# Patient Record
Sex: Male | Born: 1940 | Race: White | Hispanic: No | Marital: Married | Smoking: Never smoker
Health system: Southern US, Community
[De-identification: ages and names within clinical notes are randomized; demographics above are authoritative.]

## PROBLEM LIST (undated history)

## (undated) DIAGNOSIS — H9319 Tinnitus, unspecified ear: Secondary | ICD-10-CM

## (undated) DIAGNOSIS — M1990 Unspecified osteoarthritis, unspecified site: Secondary | ICD-10-CM

## (undated) DIAGNOSIS — I2510 Atherosclerotic heart disease of native coronary artery without angina pectoris: Secondary | ICD-10-CM

## (undated) DIAGNOSIS — B009 Herpesviral infection, unspecified: Secondary | ICD-10-CM

## (undated) DIAGNOSIS — R079 Chest pain, unspecified: Secondary | ICD-10-CM

## (undated) DIAGNOSIS — H9190 Unspecified hearing loss, unspecified ear: Secondary | ICD-10-CM

## (undated) DIAGNOSIS — I499 Cardiac arrhythmia, unspecified: Secondary | ICD-10-CM

## (undated) DIAGNOSIS — M722 Plantar fascial fibromatosis: Secondary | ICD-10-CM

## (undated) DIAGNOSIS — I4891 Unspecified atrial fibrillation: Secondary | ICD-10-CM

## (undated) DIAGNOSIS — N400 Enlarged prostate without lower urinary tract symptoms: Secondary | ICD-10-CM

## (undated) DIAGNOSIS — I00 Rheumatic fever without heart involvement: Secondary | ICD-10-CM

## (undated) HISTORY — PX: LAMINECTOMY: SHX219

## (undated) HISTORY — PX: VASECTOMY: SHX75

## (undated) HISTORY — PX: CATARACT EXTRACTION, BILATERAL: SHX1313

## (undated) HISTORY — DX: Rheumatic fever without heart involvement: I00

## (undated) HISTORY — DX: Herpesviral infection, unspecified: B00.9

## (undated) HISTORY — DX: Chest pain, unspecified: R07.9

## (undated) HISTORY — PX: COLONOSCOPY: SHX174

## (undated) HISTORY — DX: Tinnitus, unspecified ear: H93.19

## (undated) HISTORY — DX: Unspecified atrial fibrillation: I48.91

## (undated) HISTORY — PX: HERNIA REPAIR: SHX51

## (undated) HISTORY — DX: Plantar fascial fibromatosis: M72.2

## (undated) HISTORY — DX: Benign prostatic hyperplasia without lower urinary tract symptoms: N40.0

## (undated) HISTORY — PX: OTHER SURGICAL HISTORY: SHX169

## (undated) HISTORY — DX: Unspecified osteoarthritis, unspecified site: M19.90

## (undated) HISTORY — PX: TONSILLECTOMY: SUR1361

## (undated) HISTORY — DX: Unspecified hearing loss, unspecified ear: H91.90

---

## 1999-03-28 ENCOUNTER — Encounter: Payer: Self-pay | Admitting: *Deleted

## 1999-03-28 ENCOUNTER — Encounter: Admission: RE | Admit: 1999-03-28 | Discharge: 1999-03-28 | Payer: Self-pay | Admitting: *Deleted

## 1999-11-07 ENCOUNTER — Encounter: Admission: RE | Admit: 1999-11-07 | Discharge: 1999-11-07 | Payer: Self-pay | Admitting: Surgery

## 1999-11-07 ENCOUNTER — Encounter: Payer: Self-pay | Admitting: Surgery

## 1999-11-08 ENCOUNTER — Ambulatory Visit (HOSPITAL_BASED_OUTPATIENT_CLINIC_OR_DEPARTMENT_OTHER): Admission: RE | Admit: 1999-11-08 | Discharge: 1999-11-08 | Payer: Self-pay | Admitting: Surgery

## 2004-12-07 ENCOUNTER — Encounter: Payer: Self-pay | Admitting: Internal Medicine

## 2004-12-23 ENCOUNTER — Encounter: Payer: Self-pay | Admitting: Internal Medicine

## 2005-01-23 ENCOUNTER — Ambulatory Visit: Payer: Self-pay | Admitting: Cardiology

## 2005-01-26 ENCOUNTER — Encounter (INDEPENDENT_AMBULATORY_CARE_PROVIDER_SITE_OTHER): Payer: Self-pay | Admitting: Interventional Cardiology

## 2005-01-26 ENCOUNTER — Ambulatory Visit (HOSPITAL_COMMUNITY): Admission: RE | Admit: 2005-01-26 | Discharge: 2005-01-26 | Payer: Self-pay | Admitting: Interventional Cardiology

## 2005-01-26 ENCOUNTER — Encounter: Payer: Self-pay | Admitting: Internal Medicine

## 2005-03-08 ENCOUNTER — Encounter: Payer: Self-pay | Admitting: Internal Medicine

## 2005-03-08 ENCOUNTER — Ambulatory Visit (HOSPITAL_COMMUNITY): Admission: RE | Admit: 2005-03-08 | Discharge: 2005-03-08 | Payer: Self-pay | Admitting: Interventional Cardiology

## 2005-07-27 ENCOUNTER — Encounter: Payer: Self-pay | Admitting: Internal Medicine

## 2009-07-21 ENCOUNTER — Encounter: Payer: Self-pay | Admitting: Internal Medicine

## 2009-08-18 DIAGNOSIS — I4892 Unspecified atrial flutter: Secondary | ICD-10-CM | POA: Insufficient documentation

## 2009-08-18 DIAGNOSIS — I4460 Unspecified fascicular block: Secondary | ICD-10-CM

## 2009-08-19 ENCOUNTER — Telehealth: Payer: Self-pay | Admitting: Internal Medicine

## 2009-08-19 ENCOUNTER — Ambulatory Visit: Payer: Self-pay | Admitting: Internal Medicine

## 2009-08-19 DIAGNOSIS — I4891 Unspecified atrial fibrillation: Secondary | ICD-10-CM | POA: Insufficient documentation

## 2009-08-31 ENCOUNTER — Telehealth (INDEPENDENT_AMBULATORY_CARE_PROVIDER_SITE_OTHER): Payer: Self-pay | Admitting: *Deleted

## 2009-09-10 HISTORY — PX: OTHER SURGICAL HISTORY: SHX169

## 2009-09-14 ENCOUNTER — Encounter: Payer: Self-pay | Admitting: Internal Medicine

## 2009-09-21 ENCOUNTER — Ambulatory Visit: Payer: Self-pay | Admitting: Internal Medicine

## 2009-09-21 DIAGNOSIS — D66 Hereditary factor VIII deficiency: Secondary | ICD-10-CM

## 2009-09-22 LAB — CONVERTED CEMR LAB
BUN: 24 mg/dL — ABNORMAL HIGH (ref 6–23)
Basophils Absolute: 0 10*3/uL (ref 0.0–0.1)
Basophils Relative: 0.9 % (ref 0.0–3.0)
CO2: 27 meq/L (ref 19–32)
Calcium: 9.1 mg/dL (ref 8.4–10.5)
Chloride: 112 meq/L (ref 96–112)
Creatinine, Ser: 0.7 mg/dL (ref 0.4–1.5)
Eosinophils Absolute: 0.2 10*3/uL (ref 0.0–0.7)
Eosinophils Relative: 3.5 % (ref 0.0–5.0)
GFR calc non Af Amer: 128.73 mL/min (ref >60)
Glucose, Bld: 68 mg/dL — ABNORMAL LOW (ref 70–99)
HCT: 44 % (ref 39.0–52.0)
Hemoglobin: 15.2 g/dL (ref 13.0–17.0)
INR: 2.1 — ABNORMAL HIGH (ref 0.8–1.0)
Lymphocytes Relative: 23 % (ref 12.0–46.0)
Lymphs Abs: 1.1 10*3/uL (ref 0.7–4.0)
MCHC: 34.4 g/dL (ref 30.0–36.0)
MCV: 91.9 fL (ref 78.0–100.0)
Monocytes Absolute: 0.6 10*3/uL (ref 0.1–1.0)
Monocytes Relative: 12.5 % — ABNORMAL HIGH (ref 3.0–12.0)
Neutro Abs: 3 10*3/uL (ref 1.4–7.7)
Neutrophils Relative %: 60.1 % (ref 43.0–77.0)
Platelets: 150 10*3/uL (ref 150.0–400.0)
Potassium: 4.2 meq/L (ref 3.5–5.1)
Prothrombin Time: 22.7 s — ABNORMAL HIGH (ref 9.7–11.8)
RBC: 4.79 M/uL (ref 4.22–5.81)
RDW: 14.4 % (ref 11.5–14.6)
Sodium: 142 meq/L (ref 135–145)
WBC: 5 10*3/uL (ref 4.5–10.5)
aPTT: 42.6 s — ABNORMAL HIGH (ref 21.7–28.8)

## 2009-09-28 ENCOUNTER — Other Ambulatory Visit: Payer: Self-pay | Admitting: Interventional Cardiology

## 2009-09-28 ENCOUNTER — Ambulatory Visit: Payer: Self-pay | Admitting: Internal Medicine

## 2009-09-28 ENCOUNTER — Observation Stay (HOSPITAL_COMMUNITY): Admission: RE | Admit: 2009-09-28 | Discharge: 2009-09-29 | Payer: Self-pay | Admitting: Internal Medicine

## 2009-09-28 ENCOUNTER — Encounter (INDEPENDENT_AMBULATORY_CARE_PROVIDER_SITE_OTHER): Payer: Self-pay | Admitting: Interventional Cardiology

## 2009-11-29 ENCOUNTER — Ambulatory Visit: Payer: Self-pay | Admitting: Internal Medicine

## 2010-03-02 ENCOUNTER — Encounter: Payer: Self-pay | Admitting: Internal Medicine

## 2010-03-02 ENCOUNTER — Ambulatory Visit: Payer: Self-pay | Admitting: Internal Medicine

## 2010-04-12 NOTE — Letter (Signed)
Summary: Spirometry  Spirometry   Imported By: Marylou Mccoy 09/08/2009 14:03:58  _____________________________________________________________________  External Attachment:    Type:   Image     Comment:   External Document

## 2010-04-12 NOTE — Progress Notes (Signed)
  Walk in Patient Form Recieved " Pt having procedure on 7/19has questions" sent to [facility]  August 31, 2009 2:09 PM

## 2010-04-12 NOTE — Assessment & Plan Note (Signed)
Summary: eph/jml   Visit Type:  Follow-up Referring Provider:  Dr Isabel Caprice Primary Provider:  Kirby Funk, MD   History of Present Illness: The patient presents today for routine electrophysiology followup. He reports doing very well since his afib ablation.  He denies post procedural complications and has had no further sypmtoms of arrhythmia.  He reports mild SSCP several days ago which has resolved.  His energy has improved and he reports doing very well s/p ablation.  The patient denies symptoms of palpitations, shortness of breath, orthopnea, PND, lower extremity edema, dizziness, presyncope, syncope, or neurologic sequela. The patient is tolerating medications without difficulties and is otherwise without complaint today.   Current Medications (verified): 1)  Sotalol Hcl 120 Mg Tabs (Sotalol Hcl) .Marland Kitchen.. 1 in Am 1 in Pm 2)  Metoprolol Succinate 100 Mg Xr24h-Tab (Metoprolol Succinate) .... As Needed 3)  Warfarin Sodium 5 Mg Tabs (Warfarin Sodium) .... Use As Directed By Anticoagulation Clinic 4)  Acyclovir 400 Mg Tabs (Acyclovir) .... Every Other Day 5)  Glucosamine-Chondroitin   Caps (Glucosamine-Chondroit-Vit C-Mn) .... 2 in Am 1 in Pm 6)  Multivitamins   Tabs (Multiple Vitamin) .... Once Daily 7)  Calcium Carbonate   Powd (Calcium Carbonate) .... Once Daily 8)  Fexofenadine Hcl 180 Mg Tabs (Fexofenadine Hcl) .... As Needed 9)  Nasonex 50 Mcg/act Susp (Mometasone Furoate) .... As Needed  Allergies (verified): No Known Drug Allergies  Past History:  Past Medical History: Atrial fibrillation, atrial flutter s/p PVI/CTI ablation 7/11 BPH allergic rhinitis DJD  Denies h/o rheumatic fever  Past Surgical History: s/p appy Laminectomy hernia repair arthroscopic knee surgery afib/ atrial flutter ablation 7/11  Social History: Reviewed history from 08/19/2009 and no changes required. Pt lives in [place] with spouse.  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   Denies Tob/drugs  Vital Signs:  Patient profile:   70 year old male Height:      70 inches Weight:      217 pounds BMI:     31.25 Pulse rate:   52 / minute BP sitting:   120 / 80  (left arm)  Vitals Entered By: Laurance Flatten CMA (November 29, 2009 12:05 PM)  Physical Exam  General:  Well developed, well nourished, in no acute distress. Head:  normocephalic and atraumatic Eyes:  PERRLA/EOM intact; conjunctiva and lids normal. Mouth:  Teeth, gums and palate normal. Oral mucosa normal. Neck:  Neck supple, no JVD. No masses, thyromegaly or abnormal cervical nodes. Lungs:  Clear bilaterally to auscultation and percussion. Heart:  Non-displaced PMI, chest non-tender; regular rate and rhythm, S1, S2 without murmurs, rubs or gallops. Carotid upstroke normal, no bruit. Normal abdominal aortic size, no bruits. Femorals normal pulses, no bruits. Pedals normal pulses. No edema, no varicosities. Abdomen:  Bowel sounds positive; abdomen soft and non-tender without masses, organomegaly, or hernias noted. No hepatosplenomegaly. Msk:  Back normal, normal gait. Muscle strength and tone normal. Pulses:  pulses normal in all 4 extremities Extremities:  No clubbing or cyanosis. Neurologic:  Alert and oriented x 3.   EKG  Procedure date:  11/29/2009  Findings:      sinus bradycardia  Impression & Recommendations:  Problem # 1:  ATRIAL FIBRILLATION (ICD-427.31) doing very well without afib or atrial flutter symptoms s/p ablation. I have recommended that he stop sotalol, however he wisehes to discuss this with Dr Eldridge Dace first. His CHADS2 score is 0, so we will consider switching from coumadin to asprin if he remains in  sinus upon return  Other Orders: EKG w/ Interpretation (93000)  Patient Instructions: 1)  Your physician recommends that you schedule a follow-up appointment in: 3 months with Dr Johney Frame  2)  Your physician recommends that you continue on your current medications as directed.  Please refer to the Current Medication list given to you today.

## 2010-04-12 NOTE — Assessment & Plan Note (Signed)
Summary: nep/eval for ablation   Visit Type:  Initial Consult Referring Provider:  Dr Isabel Caprice Primary Provider:  Kirby Funk, MD   History of Present Illness: [name] is a pleasant 70 yo WM who presents today for EP consultation regarding atrial fibrillation.  He reports initially developing atrial fibrillation in 2006.  He was treated initially with amiodarone.  He reports doing well initially but subsequently had increased afib.  He was therefore placed on Sotalol, which then controlled his afib for several years.  Recently, he reports increasing frequency and duration of atrial fibrillation.  Presently, he reports episodes of afib 2-4 times per week, lasting up to all day.  He is chronically anticoagulated with coumadin.  He reports afib is worsened with stress.  He frequency wakes from sleep with afib.    He is now wearing an event monitor which has documented both afib and atrial flutter.  Current Medications (verified): 1)  Sotalol Hcl 120 Mg Tabs (Sotalol Hcl) .Marland Kitchen.. 1 in Am 1/2 in Pm 2)  Metoprolol Succinate 100 Mg Xr24h-Tab (Metoprolol Succinate) .... As Needed 3)  Warfarin Sodium 5 Mg Tabs (Warfarin Sodium) .... Use As Directed By Anticoagulation Clinic 4)  Acyclovir 400 Mg Tabs (Acyclovir) .... Every Other Day 5)  Glucosamine-Chondroitin   Caps (Glucosamine-Chondroit-Vit C-Mn) .... 2 in Am 1 in Pm 6)  Multivitamins   Tabs (Multiple Vitamin) 7)  Calcium Carbonate   Powd (Calcium Carbonate) .... Once Daily 8)  Fexofenadine Hcl 180 Mg Tabs (Fexofenadine Hcl) .... As Needed 9)  Nasonex 50 Mcg/act Susp (Mometasone Furoate) .... As Needed  Allergies (verified): No Known Drug Allergies  Past History:  Past Medical History: Atrial fibrillation, atrial flutter BPH allergic rhinitis DJD  Denies h/o rheumatic fever  Past Surgical History: s/p appy Laminectomy hernia repair arthroscopic knee surgery  Family History: father had afib and parkinsons brother died of heart  attack  Social History: Pt lives in [place] with spouse.  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  Denies Tob/drugs  Review of Systems       All systems are reviewed and negative except as listed in the HPI.   Vital Signs:  Patient profile:   70 year old male Height:      70 inches Weight:      215 pounds BMI:     30.96 Pulse rate:   49 / minute BP sitting:   132 / 88  (left arm)  Vitals Entered By: Laurance Flatten CMA (August 19, 2009 12:37 PM)  Physical Exam  General:  Well developed, well nourished, in no acute distress. Head:  normocephalic and atraumatic Eyes:  PERRLA/EOM intact; conjunctiva and lids normal. Nose:  no deformity, discharge, inflammation, or lesions Mouth:  Teeth, gums and palate normal. Oral mucosa normal. Neck:  Neck supple, no JVD. No masses, thyromegaly or abnormal cervical nodes. Lungs:  Clear bilaterally to auscultation and percussion. Heart:  Non-displaced PMI, chest non-tender; regular rate and rhythm, S1, S2 without murmurs, rubs or gallops. Carotid upstroke normal, no bruit. Normal abdominal aortic size, no bruits. Femorals normal pulses, no bruits. Pedals normal pulses. No edema, no varicosities. Abdomen:  Bowel sounds positive; abdomen soft and non-tender without masses, organomegaly, or hernias noted. No hepatosplenomegaly. Msk:  Back normal, normal gait. Muscle strength and tone normal. Pulses:  pulses normal in all 4 extremities Extremities:  No clubbing or cyanosis. Neurologic:  Alert and oriented x 3. Skin:  Intact without lesions or rashes. Cervical Nodes:  no significant adenopathy Psych:  Normal affect.   Echocardiogram  Procedure date:  01/26/2005  Findings:        SUMMARY   -  Overall left ventricular systolic function appeared normal. Left         ventricular ejection fraction was estimated , range being 55         % to 60 %..   -  There was mild to moderate mitral valvular regurgitation.   -  The left atrium was  dilated. There was no left atrial appendage         thrombus identified.   -  No interatrial shunt by color doppler.    ---------------------------------------------------------------   Prepared and Electronically Authenticated by   Everette Rank MD   Amended 08-Feb-2005 09:14:03      EKG  Procedure date:  08/19/2009  Findings:      sinus bradycardia 50 bpm, otherwise normal ekg  Impression & Recommendations:  Problem # 1:  ATRIAL FIBRILLATION (ICD-427.31) The patient presents today for EP consultation regarding his atrial fibrillation.  He has symptomatic paroxysmal atrial fibrillation as well as documented atrial flutter.  He has failed medical therapy with sotalol and amiodarone.  Therapeutic strategies for afib and atrial flutter including medicine and ablation were discussed in detail with the patient today. Risk, benefits, and alternatives to EP study and radiofrequency ablation were also discussed in detail today. These risks include but are not limited to stroke, bleeding, vascular damage, tamponade, perforation, damage to the esophagus, lungs, and other structures, pulmonary vein stenosis, worsening renal function, and death. The patient understands these risk and wishes to proceed.  We will therefore proceed with catheter ablation for atrial fibrillation and atrial flutter at the next available time.  I will have a TTE obtained prior to the procedure to evaluate his LA size.  I have reviewed an echo from 2006 which revealed LA size of 48mm.   His updated medication list for this problem includes:    Sotalol Hcl 120 Mg Tabs (Sotalol hcl) .Marland Kitchen... 1 in am 1/2 in pm    Metoprolol Succinate 100 Mg Xr24h-tab (Metoprolol succinate) .Marland Kitchen... As needed    Warfarin Sodium 5 Mg Tabs (Warfarin sodium) ..... Use as directed by anticoagulation clinic  Other Orders: Ablation (Ablation)  Patient Instructions: 1)  Your physician has requested that you have a TEE.  During a TEE, sound waves  are used to create images of your heart. It provides your doctor with information about the size and shape of your heart and how well your heart's chambers and valves are working. In this test, a transducer is attached to the end of a flexible tube that's guided down your throat and into your esophagus (the tube leading from your mouth to your stomach) to get a more detailed image of your heart. You are not awake for the procedure. Please see the instruction sheet given to you today.  For further information please visit [email or web address] 2)  Your physician has recommended that you have an ablation.  Catheter ablation is a medical procedure used to treat some cardiac arrhythmias (irregular heartbeats). During catheter ablation, a long, thin, flexible tube is put into a blood vessel in your groin (upper thigh), or neck. This tube is called an ablation catheter. It is then guided to your heart through the blood vessel. Radiofrequency waves destroy small areas of heart tissue where abnormal heartbeats may cause an arrhythmia to start.  Please see the instruction sheet given to you  today.

## 2010-04-12 NOTE — Progress Notes (Signed)
Summary: TEE for afib ablation  Phone Note Outgoing Call Call back at [facility] Phone [phone]   Call placed by: Gypsy Balsam RN BSN,  August 19, 2009 3:30 PM Summary of Call: Spoke with Bonita Quin at Dr Hoyle Barr office.  TEE scheduled for Monday, September 27, 2009 at 1PM.  Pt to arrive at 12Noon.  She stated she will take care of orders and scheduling at hospital.  Dr Johney Frame requested transthoracic echo be done next week to evaluate LA size.  Last evaluation was in 2006.  Scheduled for 08-26-09 at 11:15AM at [facility].  Riki Rusk with [facility] aware of need for weekly INR's.  Wife aware and agrees with above. Gypsy Balsam RN BSN  August 19, 2009 3:37 PM

## 2010-04-12 NOTE — Letter (Signed)
Summary: ELectrophysiology/Ablation Procedure Instructions  [facility], Main Office  [address]   [place], [place] [address]   Phone: [phone]  Fax: [phone]     Electrophysiology/Ablation Procedure Instructions    You are scheduled for a(n) AFIB ABLATION on TUESDAY, September 28, 2009 at 7:30 AM with Dr. Johney Frame.  1.  Please come to the Short Stay Center at [facility] at 5:30 AM on the day of your procedure.  2.  Come prepared to stay overnight.   Please bring your insurance cards and a list of your medications.  3.  Come to the [place] office on TUESDAY, September 21, 2009 for lab work.  The lab at [facility] is open from 8:30 AM to 1:30 PM and 2:30 PM to 5:00 PM.  The lab at [facility] is open from 7:30 AM to 5:30 PM.  You do not have to be fasting.  4.  Do not have anything to eat or drink after midnight the night before your procedure.  5.  All of your medications may be taken with a small amount of water.  6.  Educational material received:  _____ EP   __X___ Ablation  7.  You need to have weekly Coumadin (INR) checks between now and the procedure.  Please let Riki Rusk at [facility] know.  8.  You will need to have a TEE on Monday, September 27, 2009.  I will call you with details after we hear from Dr Hoyle Barr office about his availability.   * Occasionally, EP studies and ablations can become lengthy.  Please make your family aware of this before your procedure starts.  Average time ranges from 2-8 hours for EP studies/ablations.  Your physician will locate your family after the procedure with the results.  * If you have any questions after you get home, please call the office at [phone].

## 2010-04-12 NOTE — Progress Notes (Signed)
Summary: Letter with List of Patient Concerns   Letter with List of Patient Concerns   Imported By: Roderic Ovens 12/15/2009 12:03:08  _____________________________________________________________________  External Attachment:    Type:   Image     Comment:   External Document

## 2010-04-14 NOTE — Assessment & Plan Note (Signed)
Summary: Frederick Glover   Visit Type:  Follow-up Referring Provider:  Dr Isabel Caprice Primary Provider:  Kirby Funk, MD   History of Present Illness: The patient presents today for routine electrophysiology followup. He reports doing very well since his afib ablation.  He denies post procedural complications and has had no further sypmtoms of arrhythmia.  He is pleased with his results. The patient denies symptoms of palpitations, shortness of breath, orthopnea, PND, lower extremity edema, dizziness, presyncope, syncope, or neurologic sequela. The patient is tolerating medications without difficulties and is otherwise without complaint today.   Current Medications (verified): 1)  Sotalol Hcl 80 Mg Tabs (Sotalol Hcl) .... Take One Tablet By Mouth Twice A Day 2)  Metoprolol Succinate 100 Mg Xr24h-Tab (Metoprolol Succinate) .... As Needed Frederick)  Warfarin Sodium 5 Mg Tabs (Warfarin Sodium) .... Use As Directed By Anticoagulation Clinic 4)  Acyclovir 400 Mg Tabs (Acyclovir) .... Every Other Day 5)  Glucosamine-Chondroitin   Caps (Glucosamine-Chondroit-Vit C-Mn) .... 2 in Am 1 in Pm 6)  Multivitamins   Tabs (Multiple Vitamin) .... Once Daily 7)  Calcium Carbonate   Powd (Calcium Carbonate) .... Once Daily 8)  Fexofenadine Hcl 180 Mg Tabs (Fexofenadine Hcl) .... As Needed 9)  Nasonex 50 Mcg/act Susp (Mometasone Furoate) .... As Needed  Allergies (verified): No Known Drug Allergies  Past History:  Past Medical History: Reviewed history from 11/29/2009 and no changes required. Atrial fibrillation, atrial flutter s/p PVI/CTI ablation 7/11 BPH allergic rhinitis DJD  Denies h/o rheumatic fever  Past Surgical History: Reviewed history from 11/29/2009 and no changes required. s/p appy Laminectomy hernia repair arthroscopic knee surgery afib/ atrial flutter ablation 7/11  Vital Signs:  Patient profile:   70 year old male Height:      70 inches Weight:      217 pounds BMI:      31.25 Pulse rate:   59 / minute BP sitting:   120 / 80  (left arm)  Vitals Entered By: Laurance Flatten CMA (March 02, 2010 11:06 AM)  Physical Exam  General:  Well developed, well nourished, in no acute distress. Head:  normocephalic and atraumatic Eyes:  PERRLA/EOM intact; conjunctiva and lids normal. Mouth:  Teeth, gums and palate normal. Oral mucosa normal. Neck:  Neck supple, no JVD. No masses, thyromegaly or abnormal cervical nodes. Lungs:  Clear bilaterally to auscultation and percussion. Heart:  Non-displaced PMI, chest non-tender; regular rate and rhythm, S1, S2 without murmurs, rubs or gallops. Carotid upstroke normal, no bruit. Normal abdominal aortic size, no bruits. Femorals normal pulses, no bruits. Pedals normal pulses. No edema, no varicosities. Abdomen:  Bowel sounds positive; abdomen soft and non-tender without masses, organomegaly, or hernias noted. No hepatosplenomegaly. Msk:  Back normal, normal gait. Muscle strength and tone normal. Extremities:  No clubbing or cyanosis. Neurologic:  Alert and oriented x Frederick.   EKG  Procedure date:  03/02/2010  Findings:      sinus bradycardia 59 bpm, otherwise normal ekg  Impression & Recommendations:  Problem # 1:  ATRIAL FIBRILLATION (ICD-427.31)  doing very well without afib or atrial flutter symptoms s/p ablation. I have recommended that he stop sotalol, however he wisehes to discuss this with Dr Eldridge Dace first. His CHADS2 score is 0, so we will consider switching from coumadin to asprin if he remains in sinus  His updated medication list for this problem includes:    Sotalol Hcl 80 Mg Tabs (Sotalol hcl) .Marland Kitchen... Take one tablet by mouth twice a day  Metoprolol Succinate 100 Mg Xr24h-tab (Metoprolol succinate) .Marland Kitchen... As needed    Warfarin Sodium 5 Mg Tabs (Warfarin sodium) ..... Use as directed by anticoagulation clinic  Other Orders: EKG w/ Interpretation (93000)  Patient Instructions: 1)  Your physician recommends  that you schedule a follow-up appointment in: 6 months. 2)  Your physician recommends that you continue on your current medications as directed. Please refer to the Current Medication list given to you today.

## 2010-05-04 NOTE — Letter (Signed)
Summary: Self-Recorded Notes for Dr. Johney Frame  Self-Recorded Notes for Dr. Johney Frame   Imported By: Marylou Mccoy 04/25/2010 14:56:32  _____________________________________________________________________  External Attachment:    Type:   Image     Comment:   External Document

## 2010-05-28 LAB — PROTIME-INR
INR: 2.26 — ABNORMAL HIGH (ref 0.00–1.49)
Prothrombin Time: 24.8 seconds — ABNORMAL HIGH (ref 11.6–15.2)

## 2010-07-29 NOTE — Op Note (Signed)
NAMERHET, RORKE               ACCOUNT NO.:  [id number]   MEDICAL RECORD NO.:  [id number]          PATIENT TYPE:  AMB   LOCATION:  ENDO                         FACILITY:  [facility]   PHYSICIAN:  Corky Crafts, MDDATE OF BIRTH:  04-Jun-1940   DATE OF PROCEDURE:  01/26/2005  DATE OF DISCHARGE:                                 OPERATIVE REPORT   PROCEDURE PERFORMED:  DC cardioversion.   INDICATIONS FOR PROCEDURE:  Atrial fibrillation.   OPERATOR:  Corky Crafts, MD   ANESTHESIOLOGIST:  Bedelia Person, M.D.   ANESTHESIA:  175 mg of sodium pentothal IV.   DESCRIPTION OF PROCEDURE:  Informed consent was obtained from the patient  for transesophageal echocardiogram and DC cardioversion.  The  transesophageal echocardiogram showed no intracardiac thrombus.  Cardioversion pads were placed on the chest, back.  Sodium pentothal 175 mg  IV was given x1.  After adequate anesthesia was achieved, a 100 joule  biphasic shock was administered without restoration of normal sinus rhythm.  A 120 joule biphasic shock was subsequently administered and normal sinus  rhythm was restored.  The patient varied between normal sinus rhythm and  sinus bradycardia post cardioversion. There were no apparent complications.   IMPRESSION:  1.  No intracardiac thrombus.  2.  Successful restoration of normal sinus rhythm with DC cardioversion.   RECOMMENDATIONS:  Will decrease the patient's metoprolol to 50 mg daily.  Patient will follow up with Corky Crafts, MD           ______________________________  Corky Crafts, MD     JSV/MEDQ  D:  01/26/2005  T:  01/27/2005  Job:  [id number]

## 2010-07-29 NOTE — Op Note (Signed)
[place]. [facility]  Patient:    CHEROKEE, CLOWERS                      MRN: [id number] Proc. Date: 11/08/99 Adm. Date:  [id number] Attending:  Katha Cabal CC:         Lum Babe, M.D.   Operative Report  PREOPERATIVE DIAGNOSIS:  Left inguinal hernia.  POSTOPERATIVE DIAGNOSIS:  Left indirect inguinal hernia.  OPERATION:  Left inguinal herniorrhaphy with mesh.  SURGEON:  Thornton Park. Daphine Deutscher, M.D.  ANESTHESIA:  MAC.  DESCRIPTION OF PROCEDURE:  Eual Lindstrom is a 70 year old gentleman who was taken to [facility], OR #4 and given intravenous sedation.  The left inguinal region was prep widely with Betadine and draped sterilely.  I did a regional block with a mixture of Marcaine and lidocaine over the anterior superior iliac spine and then injected an oblique incision which I had marked along with a cross hatch mark, which I further marked with a stitch.  An oblique incision was then made, carried down to the fat of the external oblique.  This was incised along the fibers down to the external ring.  An ilioinguinal nerve branch was identified and retracted with the superior flap of the external oblique.  The cord structures were mobilized and Penrose drain placed about it.  I incised the cremasteric fibers proximally and found a very large sac which was probably about 4 inches in length and I separated that from the cord structures.  I then opened it and found that it contained an omentum which I reduced into the abdominal cavity.  There was a little free intraperitoneal abdominal body which I put out subsequently to go with the hernia sac.  After freeing the sac and identifying the ring, I oversewed that with 2-0 silk proximally, twisted it off and ligated it tying it around the hernia sac.  The hernia sac was then cut and it was examined and no bleeding was noted and the suture was cut so that the remnant of sac retracted up into the  abdomen.  Next, I cut a piece of mesh to fix.  I had good visualization, sutured long the inguinal ligament with a 3-0 Prolene carrying it above the ring.  Medially I used a running 2-0 Prolene medially carrying it up superiorly.  I cut the mesh to fit, placed it around the cord structures and sutured it to itself with three horizontal mattress sutures of 2-0 Prolene.  This reconstructed the ring and made a nice closure and repair of the entire inguinal canal.  The ilioinguinal nerve branch was allowed to return to a position lying on top of the mesh.  The external oblique was closed with a running 2-0 Vicryl, 4-0 Vicryl was used in the subcutaneous tissue.  At this point the previous hatch mark sutures were used to appropriate the subcutaneous tissue.  The wound was then closed with 4-0 and 5-0 Vicryl with benzoin and Steri-Strips on the skin. The patient tolerated the procedure well.  The patient will be given Mepergan Fortis to take for pain.  Instructions were given regarding activity to his wife as well as postop instruction sheet.  The patient will be seen in the office in approximately four weeks. DD:  11/08/99 TD:  11/09/99 Job: [id number]

## 2010-09-05 ENCOUNTER — Encounter: Payer: Self-pay | Admitting: Internal Medicine

## 2010-11-03 ENCOUNTER — Encounter: Payer: Self-pay | Admitting: Internal Medicine

## 2010-11-03 ENCOUNTER — Ambulatory Visit (INDEPENDENT_AMBULATORY_CARE_PROVIDER_SITE_OTHER): Payer: Medicare Other | Admitting: Internal Medicine

## 2010-11-03 VITALS — BP 118/76 | HR 62 | Ht 70.0 in | Wt 191.4 lb

## 2010-11-03 DIAGNOSIS — I4891 Unspecified atrial fibrillation: Secondary | ICD-10-CM

## 2010-11-03 NOTE — Patient Instructions (Signed)
Your physician wants you to follow-up in: 12 months with Dr Allred You will receive a reminder letter in the mail two months in advance. If you don't receive a letter, please call our office to schedule the follow-up appointment.  

## 2010-11-03 NOTE — Progress Notes (Signed)
Addended by: Burnett Kanaris A on: 11/03/2010 01:28 PM   Modules accepted: Orders

## 2010-11-03 NOTE — Assessment & Plan Note (Signed)
Doing very well s/p ablation without recurrence off AAD.  His CHADS2 score is 0.  I have recommended that he stop coumadin and start ASA.  He is very reluctant to do so.  He will consider this option and possibly stop coumadin after talking with Dr Eldridge Dace.  I will see him again in a year.

## 2010-11-03 NOTE — Progress Notes (Signed)
The patient presents today for routine electrophysiology followup.  Since last being seen in our clinic, the patient reports doing very well.  He denies any further afib s/p ablation.  He stopped sotalol 2/12.  Today, he denies symptoms of palpitations, chest pain, shortness of breath, orthopnea, PND, lower extremity edema, dizziness, presyncope, syncope, or neurologic sequela.  The patient feels that he is tolerating medications without difficulties and is otherwise without complaint today.   Past Medical History  Diagnosis Date  . Atrial fibrillation     atrial flutter PVI/CTI ablation 7/11  . BPH (benign prostatic hyperplasia)   . Allergic rhinitis   . DJD (degenerative joint disease)   . Rheumatic fever     denies h/o    Past Surgical History  Procedure Date  . S/p appy   . Laminectomy   . Hernia repair   . Arthroscopic knee surgery   . Afib/atrial flutter ablation 7/11    afib ablation by [facility]    Current Outpatient Prescriptions  Medication Sig Dispense Refill  . acyclovir (ZOVIRAX) 400 MG tablet Take 400 mg by mouth. Every other day       . fexofenadine (ALLEGRA) 180 MG tablet Take 180 mg by mouth daily.        . Glucosamine-Chondroit-Vit C-Mn (GLUCOSAMINE-CHONDROITIN) CAPS Take by mouth 3 (three) times daily. Take 2 tab in the morning and 1 tab at night      . metoprolol (TOPROL-XL) 100 MG 24 hr tablet Take 100 mg by mouth as needed.       . mometasone (NASONEX) 50 MCG/ACT nasal spray Place 2 sprays into the nose as needed.       . Multiple Vitamins-Minerals (MULTIVITAL) tablet Take 1 tablet by mouth daily.        Marland Kitchen warfarin (COUMADIN) 5 MG tablet Take 5 mg by mouth daily. Take 5mg  on Su, Tu, Th, Sa, and 2.5mg  M,W,F in the morning.        Not on File  History   Social History  . Marital Status: Married    Spouse Name: N/A    Number of Children: N/A  . Years of Education: N/A   Occupational History  . Not on file.   Social History Main Topics  . Smoking status:  Never Smoker   . Smokeless tobacco: Not on file  . Alcohol Use: Yes     rare etoh   . Drug Use: No  . Sexually Active: Not on file   Other Topics Concern  . Not on file   Social History Narrative   Lives in [place] with spouse. Retired Psychologist, forensic at [employer]. RAre ETOH. Denies Tob/drugs.     Family History  Problem Relation Age of Onset  . Parkinsonism Father   . Atrial fibrillation Father   . Heart attack Brother    Physical Exam: Filed Vitals:   11/03/10 1054  BP: 118/76  Pulse: 62  Height: 5\' 10"  (1.778 m)  Weight: 191 lb 6.4 oz (86.818 kg)    GEN- The patient is well appearing, alert and oriented x 3 today.   Head- normocephalic, atraumatic Eyes-  Sclera clear, conjunctiva pink Ears- hearing intact Oropharynx- clear Neck- supple, no JVP Lymph- no cervical lymphadenopathy Lungs- Clear to ausculation bilaterally, normal work of breathing Heart- Regular rate and rhythm, no murmurs, rubs or gallops, PMI not laterally displaced GI- soft, NT, ND, + BS Extremities- no clubbing, cyanosis, or edema MS- no significant deformity or atrophy Skin- no rash  or lesion Psych- euthymic mood, full affect Neuro- strength and sensation are intact  ekg today reveals sinus rhythm 62 bpm, normal ekg  Assessment and Plan:

## 2010-11-04 NOTE — Progress Notes (Signed)
Addended by: Burnett Kanaris A on: 11/04/2010 03:27 PM   Modules accepted: Orders

## 2010-11-25 NOTE — Progress Notes (Signed)
Addended by: Burnett Kanaris A on: 11/25/2010 04:16 PM   Modules accepted: Orders

## 2011-12-11 ENCOUNTER — Other Ambulatory Visit: Payer: Self-pay | Admitting: Dermatology

## 2013-01-28 ENCOUNTER — Ambulatory Visit (INDEPENDENT_AMBULATORY_CARE_PROVIDER_SITE_OTHER): Payer: Medicare Other | Admitting: Interventional Cardiology

## 2013-01-28 ENCOUNTER — Encounter: Payer: Self-pay | Admitting: Interventional Cardiology

## 2013-01-28 ENCOUNTER — Ambulatory Visit: Payer: Medicare Other | Admitting: Interventional Cardiology

## 2013-01-28 VITALS — BP 130/82 | HR 71 | Ht 71.0 in | Wt 200.0 lb

## 2013-01-28 DIAGNOSIS — I4891 Unspecified atrial fibrillation: Secondary | ICD-10-CM

## 2013-01-28 NOTE — Patient Instructions (Signed)
Your physician wants you to follow-up in: 1 year with Dr. Varanasi. You will receive a reminder letter in the mail two months in advance. If you don't receive a letter, please call our office to schedule the follow-up appointment.  Your physician recommends that you continue on your current medications as directed. Please refer to the Current Medication list given to you today.  

## 2013-01-28 NOTE — Progress Notes (Signed)
Patient ID: Frederick Glover, male   DOB: 02-19-1941, 72 y.o.   MRN: [id number]    [address] [place], [place]  [address] Phone: [phone] Fax:  [phone]  Date:  01/28/2013   ID:  Frederick Glover, DOB [birth year], MRN [id number]  PCP:  Lillia Mountain, MD      History of Present Illness: Frederick Glover is a 72 y.o. male who has had PAF. He saw Dr. Johney Frame who recommended that he come off of coumadin. Atrial Fibrillation F/U:  Denies : Chest pain.  Dizziness.  Leg edema.  Orthopnea.  Palpitations.  Shortness of breath.  Syncope.  Exercises 3-4x/week. Walking.    Wt Readings from Last 3 Encounters:  01/28/13 200 lb (90.719 kg)  11/03/10 191 lb 6.4 oz (86.818 kg)  03/02/10 217 lb (98.431 kg)     Past Medical History  Diagnosis Date  . Atrial fibrillation     atrial flutter PVI/CTI ablation 7/11  . BPH (benign prostatic hyperplasia)   . Allergic rhinitis   . DJD (degenerative joint disease)   . Rheumatic fever     denies h/o     Current Outpatient Prescriptions  Medication Sig Dispense Refill  . acyclovir (ZOVIRAX) 400 MG tablet Take 400 mg by mouth. Every other day       . aspirin 81 MG tablet Take 81 mg by mouth daily.      . fexofenadine (ALLEGRA) 180 MG tablet Take 180 mg by mouth daily.        . Glucosamine-Chondroit-Vit C-Mn (GLUCOSAMINE-CHONDROITIN) CAPS Take 2 capsules by mouth daily.       . metoprolol (TOPROL-XL) 100 MG 24 hr tablet Take 100 mg by mouth as needed.       . mometasone (NASONEX) 50 MCG/ACT nasal spray Place 2 sprays into the nose as needed.       . Multiple Vitamins-Minerals (MULTIVITAL) tablet Take 1 tablet by mouth daily.         No current facility-administered medications for this visit.    Allergies:   No Known Allergies  Social History:  The patient  reports that he has never smoked. He does not have any smokeless tobacco history on file. He reports that he drinks alcohol. He reports that he does not use illicit  drugs.   Family History:  The patient's family history includes Atrial fibrillation in his father; Heart attack in his brother; Parkinsonism in his father.   ROS:  Please see the history of present illness.  No nausea, vomiting.  No fevers, chills.  No focal weakness.  No dysuria.     All other systems reviewed and negative.   PHYSICAL EXAM: VS:  BP 130/82  Pulse 71  Ht 5\' 11"  (1.803 m)  Wt 200 lb (90.719 kg)  BMI 27.91 kg/m2 Well nourished, well developed, in no acute distress HEENT: normal Neck: no JVD, no carotid bruits Cardiac:  normal S1, S2; RRR;  Lungs:  clear to auscultation bilaterally, no wheezing, rhonchi or rales Abd: soft, nontender, no hepatomegaly Ext: no edema Skin: warm and dry Neuro:   no focal abnormalities noted  EKG:  NSR, no ST segment changes    ASSESSMENT AND PLAN:  Atrial fibrillation  Continue Toprol XL Tablet Extended Release 24 Hour, 100 MG, 1 tablet, Orally, Once a day as needed, 30 days, 30, Refills 6 Diagnostic Imaging:EKG Harward,Amy 01/25/2012 10:39:01 AM > Frederick Glover,Frederick Glover 01/25/2012 10:46:28 AM > Normal  Maintaining NSR. No problems with exercise.  Has not used any toprol.   Lifeline screening reviewed.  Mild RICA plaque.  Increased Right ABI Preventive Medicine  Adult topics discussed:  Diet: healthy diet.  Exercise: at least 30 minutes of aerobic exercise, 5 days a week.    Procedure Codes     Signed, Fredric Mare, MD, [facility] 01/28/2013 2:58 PM

## 2013-01-30 ENCOUNTER — Other Ambulatory Visit: Payer: Self-pay | Admitting: Cardiology

## 2013-01-30 NOTE — Telephone Encounter (Addendum)
Pt sent a message through MyChart stating his medication list was not correct. I left a voicemail for pt to return call to go over medications.

## 2013-01-31 MED ORDER — FLUOROURACIL 0.5 % EX CREA: TOPICAL_CREAM | CUTANEOUS | Status: DC

## 2013-01-31 MED ORDER — FEXOFENADINE HCL 180 MG PO TABS: ORAL_TABLET | ORAL | Status: DC

## 2013-01-31 MED ORDER — ACYCLOVIR 400 MG PO TABS: ORAL_TABLET | ORAL | Status: AC

## 2013-01-31 MED ORDER — METOPROLOL SUCCINATE ER 100 MG PO TB24: ORAL_TABLET | ORAL | Status: DC

## 2013-01-31 NOTE — Telephone Encounter (Signed)
Medications updated. All cardiac medications were correct. To Dr. Eldridge Dace, [place] Child[place].

## 2013-04-12 ENCOUNTER — Encounter: Payer: Self-pay | Admitting: Cardiology

## 2013-12-28 ENCOUNTER — Telehealth: Payer: Self-pay | Admitting: Internal Medicine

## 2013-12-28 NOTE — Telephone Encounter (Signed)
I spoke with [name] by phone today.  Pt is doing great post ablation.  He is off of AAD therapy and has had no symptoms of AF since his procedure.  He will follow with Dr Eldridge DaceVaranasi and I will see as needed going forward.

## 2014-01-28 ENCOUNTER — Ambulatory Visit (INDEPENDENT_AMBULATORY_CARE_PROVIDER_SITE_OTHER): Payer: Medicare Other | Admitting: Interventional Cardiology

## 2014-01-28 ENCOUNTER — Encounter: Payer: Self-pay | Admitting: Interventional Cardiology

## 2014-01-28 VITALS — BP 130/88 | HR 62 | Ht 70.0 in | Wt 201.0 lb

## 2014-01-28 DIAGNOSIS — I4891 Unspecified atrial fibrillation: Secondary | ICD-10-CM

## 2014-01-28 NOTE — Patient Instructions (Signed)
Your physician wants you to follow-up in: 1 year with Dr. Varanasi. You will receive a reminder letter in the mail two months in advance. If you don't receive a letter, please call our office to schedule the follow-up appointment.  Your physician recommends that you continue on your current medications as directed. Please refer to the Current Medication list given to you today.  

## 2014-01-28 NOTE — Progress Notes (Signed)
Patient ID: Frederick Glover, male   DOB: 09/24/1940, 73 y.o.   MRN: [id number] Patient ID: Frederick Glover, male   DOB: 11/21/1940, 73 y.o.   MRN: [id number]    [address] [place], [place]  [address] Phone: [phone] Fax:  [phone]  Date:  01/28/2014   ID:  Frederick Glover, DOB 03/24/1940, MRN [id number]  PCP:  Lillia MountainGRIFFIN,JOHN JOSEPH, MD      History of Present Illness: Frederick Glover is a 73 y.o. male who has had PAF. He saw Dr. Johney FrameAllred who recommended that he come off of coumadin several years ago.  He had an AFib ablation in 2011. Atrial Fibrillation F/U:  Denies : Chest pain.  Dizziness.  Syncope.  Leg edema.  Orthopnea.  Palpitations.  Shortness of breath.    Exercises 2x/week. Walking.  He is still active during the day  Has felt well since his last visit.  Not checking BP at home.  Never feels heart out of rhythm.  He has not used any metoprolol.      Wt Readings from Last 3 Encounters:  01/28/14 201 lb (91.173 kg)  01/28/13 200 lb (90.719 kg)  11/03/10 191 lb 6.4 oz (86.818 kg)     Past Medical History  Diagnosis Date  . Atrial fibrillation     atrial flutter PVI/CTI ablation 7/11  . BPH (benign prostatic hyperplasia)   . Allergic rhinitis   . DJD (degenerative joint disease)   . Rheumatic fever     denies h/o   . Tinnitus   . Herpes simplex     , left butt  . Chest pain   . Plantar fasciitis   . Hearing loss     Current Outpatient Prescriptions  Medication Sig Dispense Refill  . acyclovir (ZOVIRAX) 400 MG tablet 2 tablets twice a day for 5 days as needed for flare of fever blister    . aspirin 81 MG tablet Take 81 mg by mouth daily.    . fexofenadine (ALLEGRA) 180 MG tablet 1 tablet as needed    . fluoruracil (CARAC) 0.5 % cream Apply to skin twice daily for 2 weeks as needed for pre-cancerous skin lesions 30 g 0  . FLUZONE HIGH-DOSE 0.5 ML SUSY   0  . Glucosamine-Chondroit-Vit C-Mn (GLUCOSAMINE-CHONDROITIN) CAPS Take 2 capsules by  mouth daily.     . metoprolol succinate (TOPROL-XL) 100 MG 24 hr tablet 1 tablet po daily for afib episodes    . mometasone (NASONEX) 50 MCG/ACT nasal spray Place 2 sprays into the nose as needed.     . Multiple Vitamins-Minerals (MULTIVITAL) tablet Take 1 tablet by mouth daily.       No current facility-administered medications for this visit.    Allergies:   No Known Allergies  Social History:  The patient  reports that he has never smoked. He does not have any smokeless tobacco history on file. He reports that he drinks alcohol. He reports that he does not use illicit drugs.   Family History:  The patient's family history includes Atrial fibrillation in his father; Heart attack in his brother; Parkinsonism in his father.   ROS:  Please see the history of present illness.  No nausea, vomiting.  No fevers, chills.  No focal weakness.  No dysuria.     All other systems reviewed and negative.   PHYSICAL EXAM: VS:  BP 130/88 mmHg  Pulse 62  Ht 5\' 10"  (1.778 m)  Wt 201 lb (  91.173 kg)  BMI 28.84 kg/m2 Well nourished, well developed, in no acute distress HEENT: normal Neck: no JVD, no carotid bruits Cardiac:  normal S1, S2; RRR;  Lungs:  clear to auscultation bilaterally, no wheezing, rhonchi or rales Abd: soft, nontender, no hepatomegaly Ext: no edema Skin: warm and dry Neuro:   no focal abnormalities noted  EKG:  NSR, no ST segment changes    ASSESSMENT AND PLAN:  Atrial fibrillation  Continue Toprol XL Tablet Extended Release 24 Hour, 100 MG, 1 tablet, Orally, Once a day as needed, 30 days, 30, Refills 6   Maintaining NSR. No problems with exercise.  Has not used any toprol.   Lifeline screening rin 2013.  Mild RICA plaque.  Increased Right ABI Preventive Medicine  Adult topics discussed:  Diet: healthy diet.  Exercise: at least 30 minutes of aerobic exercise, 5 days a week.         Signed, Fredric MareJay S. [place] Yearby, MD, [facility] 01/28/2014 3:50 PM

## 2014-09-07 ENCOUNTER — Other Ambulatory Visit: Payer: Self-pay

## 2014-11-04 ENCOUNTER — Encounter: Payer: Self-pay | Admitting: Interventional Cardiology

## 2015-02-10 NOTE — Progress Notes (Signed)
Patient ID: Frederick Glover, male   DOB: 04-24-1940, 74 y.o.   MRN: [id number]     Cardiology Office Note   Date:  02/11/2015   ID:  Frederick Glover, DOB 1940/07/28, MRN [id number]  PCP:  Lillia Mountain, MD    No chief complaint on file. f/u AFib   Wt Readings from Last 3 Encounters:  02/11/15 203 lb (92.08 kg)  01/28/14 201 lb (91.173 kg)  01/28/13 200 lb (90.719 kg)       History of Present Illness: Frederick Glover is a 74 y.o. male  who has had PAF. He saw Dr. Johney Frame who recommended that he come off of coumadin several years ago. He had an AFib ablation in 2011. Atrial Fibrillation F/U:  Denies : Chest pain.  Dizziness.  Syncope.  Leg edema.  Orthopnea.  Palpitations.  Shortness of breath.    Exercises 3-4x/week, about 30-45 minutes. Walking. He is still active during the day  Has felt well since his last visit.  Not checking BP at home. Never feels heart out of rhythm. He has not used any metoprolol, which he has for prn use if he feels AFib.       Past Medical History  Diagnosis Date  . Atrial fibrillation (HCC)     atrial flutter PVI/CTI ablation 7/11  . BPH (benign prostatic hyperplasia)   . Allergic rhinitis   . DJD (degenerative joint disease)   . Rheumatic fever     denies h/o   . Tinnitus   . Herpes simplex     , left butt  . Chest pain   . Plantar fasciitis   . Hearing loss     Past Surgical History  Procedure Laterality Date  . S/p appy    . Laminectomy    . Hernia repair    . Arthroscopic knee surgery    . Afib/atrial flutter ablation  7/11    afib ablation by [facility]  . Vasectomy       Current Outpatient Prescriptions  Medication Sig Dispense Refill  . acyclovir (ZOVIRAX) 400 MG tablet 2 tablets twice a day for 5 days as needed for flare of fever blister    . aspirin 81 MG tablet Take 81 mg by mouth daily.    . fexofenadine (ALLEGRA) 180 MG tablet Take 180 mg by mouth daily. allergies    . fluoruracil (CARAC) 0.5 % cream  Apply to skin twice daily for 2 weeks as needed for pre-cancerous skin lesions 30 g 0  . FLUZONE HIGH-DOSE 0.5 ML SUSY   0  . Glucosamine-Chondroit-Vit C-Mn (GLUCOSAMINE-CHONDROITIN) CAPS Take 2 capsules by mouth daily.     . metoprolol succinate (TOPROL-XL) 100 MG 24 hr tablet 1 tablet po daily for afib episodes    . mometasone (NASONEX) 50 MCG/ACT nasal spray Place 2 sprays into the nose as needed (allergies).     . Multiple Vitamins-Minerals (MULTIVITAL) tablet Take 1 tablet by mouth daily.       No current facility-administered medications for this visit.    Allergies:   Review of patient's allergies indicates no known allergies.    Social History:  The patient  reports that he has never smoked. He does not have any smokeless tobacco history on file. He reports that he drinks alcohol. He reports that he does not use illicit drugs.   Family History:  The patient's family history includes Atrial fibrillation in his father; Heart attack in his brother; Parkinsonism in his father; Stroke in  his mother. There is no history of Hypertension.    ROS:  Please see the history of present illness.   All other systems are reviewed and negative.    PHYSICAL EXAM: VS:  BP 122/80 mmHg  Pulse 64  Ht 5\' 10"  (1.778 m)  Wt 203 lb (92.08 kg)  BMI 29.13 kg/m2 , BMI Body mass index is 29.13 kg/(m^2). GEN: Well nourished, well developed, in no acute distress HEENT: normal Neck: no JVD, carotid bruits, or masses Cardiac: RRR; no murmurs, rubs, or gallops,no edema  Respiratory:  clear to auscultation bilaterally, normal work of breathing GI: soft, nontender, nondistended, + BS MS: no deformity or atrophy Skin: warm and dry, no rash Neuro:  Strength and sensation are intact Psych: euthymic mood, full affect   EKG:   The ekg ordered today demonstrates Normal ECG   Recent Labs: No results found for requested labs within last 365 days.   Lipid Panel No results found for: CHOL, TRIG, HDL,  CHOLHDL, VLDL, LDLCALC, LDLDIRECT   Other studies Reviewed: Additional studies/ records that were reviewed today with results demonstrating: prior AFib and Aflutter ablation.   ASSESSMENT AND PLAN:  1.  AFib:  Continue Toprol XL Tablet Extended Release 24 Hour, 100 MG, 1 tablet, Orally, Once a day as needed, 30 days, 30, Refills 6  ;  He has not needed this medicine.  Maintaining NSR. No problems with exercise.  Has not used any toprol. No sx of angina.  Continue regular exercise.   Lifeline screening in 2013. Mild RICA plaque. Increased Right ABI.  Continue aggressive preventive therapy.            Current medicines are reviewed at length with the patient today.  The patient concerns regarding his medicines were addressed.  The following changes have been made:  No change  Labs/ tests ordered today include: checked by PMD- will obtain those results No orders of the defined types were placed in this encounter.    Recommend 150 minutes/week of aerobic exercise Low fat, low carb, high fiber diet recommended  Disposition:   FU in  1year   Delorise JacksonSigned, Dreshawn Hendershott S., MD  02/11/2015 2:22 PM    [facility] [address], [place], [place]  [address] Phone: [phone]; Fax: [phone]

## 2015-02-11 ENCOUNTER — Ambulatory Visit (INDEPENDENT_AMBULATORY_CARE_PROVIDER_SITE_OTHER): Payer: Medicare Other | Admitting: Interventional Cardiology

## 2015-02-11 ENCOUNTER — Encounter: Payer: Self-pay | Admitting: Interventional Cardiology

## 2015-02-11 VITALS — BP 122/80 | HR 64 | Ht 70.0 in | Wt 203.0 lb

## 2015-02-11 DIAGNOSIS — I4891 Unspecified atrial fibrillation: Secondary | ICD-10-CM

## 2015-02-11 NOTE — Patient Instructions (Signed)
**Note De-identified Frederick Glover Obfuscation** Medication Instructions:  Same-no changes  Labwork: None  Testing/Procedures: None  Follow-Up: Your physician wants you to follow-up in: 1 year. You will receive a reminder letter in the mail two months in advance. If you don't receive a letter, please call our office to schedule the follow-up appointment.      If you need a refill on your cardiac medications before your next appointment, please call your pharmacy.   

## 2015-08-23 ENCOUNTER — Other Ambulatory Visit: Payer: Self-pay | Admitting: Urology

## 2015-08-23 DIAGNOSIS — R972 Elevated prostate specific antigen [PSA]: Secondary | ICD-10-CM

## 2015-08-30 ENCOUNTER — Ambulatory Visit (HOSPITAL_COMMUNITY): Payer: Medicare Other

## 2015-09-15 ENCOUNTER — Ambulatory Visit (HOSPITAL_COMMUNITY)
Admission: RE | Admit: 2015-09-15 | Discharge: 2015-09-15 | Disposition: A | Discharge status: Home / Self Care | Payer: Medicare Other | Source: Ambulatory Visit | Attending: Urology | Admitting: Urology

## 2015-09-15 DIAGNOSIS — R972 Elevated prostate specific antigen [PSA]: Secondary | ICD-10-CM | POA: Diagnosis present

## 2015-09-15 DIAGNOSIS — N400 Enlarged prostate without lower urinary tract symptoms: Secondary | ICD-10-CM | POA: Insufficient documentation

## 2015-09-15 LAB — POCT I-STAT CREATININE: Creatinine, Ser: 0.9 mg/dL (ref 0.61–1.24)

## 2015-09-15 MED ORDER — GADOBENATE DIMEGLUMINE 529 MG/ML IV SOLN
20.0000 mL | Freq: Once | INTRAVENOUS | Status: AC | PRN
  Administered 2015-09-15: 20 mL via INTRAVENOUS

## 2016-02-08 ENCOUNTER — Encounter: Payer: Self-pay | Admitting: Interventional Cardiology

## 2016-02-14 NOTE — Progress Notes (Signed)
Patient ID: Frederick Glover, male   DOB: 12/12/1940, 75 y.o.   MRN: [id number]     Cardiology Office Note   Date:  02/15/2016   ID:  Frederick Glover, DOB 03/03/1941, MRN [id number]  PCP:  Lillia MountainGRIFFIN,JOHN JOSEPH, MD    No chief complaint on file. f/u AFib   Wt Readings from Last 3 Encounters:  02/15/16 202 lb 12.8 oz (92 kg)  09/15/15 203 lb (92.1 kg)  02/11/15 203 lb (92.1 kg)       History of Present Illness: Frederick Glover is a 75 y.o. male  who has had PAF. He saw Dr. Johney FrameAllred who recommended that he come off of coumadin several years ago. He had an AFib ablation in 2011. Atrial Fibrillation F/U:  Denies : Chest pain. Dizziness. Syncope. Leg edema.  Orthopnea.  Palpitations.  Shortness of breath.    Has felt well since his last visit.  Not checking BP at home. Never feels heart out of rhythm. He has not used any metoprolol, which he has for prn use if he feels AFib.   Exercises 3-4x/week, about 30-45 minutes. Walking. He is still active during the day.  [place]   [facility] No problems with exertion.        Past Medical History:  Diagnosis Date  . Allergic rhinitis   . Atrial fibrillation (HCC)    atrial flutter PVI/CTI ablation 7/11  . BPH (benign prostatic hyperplasia)   . Chest pain   . DJD (degenerative joint disease)   . Hearing loss   . Herpes simplex    , left butt  . Plantar fasciitis   . Rheumatic fever    denies h/o   . Tinnitus     Past Surgical History:  Procedure Laterality Date  . afib/atrial flutter ablation  7/11   afib ablation by [facility]  . arthroscopic knee surgery    . HERNIA REPAIR    . LAMINECTOMY    . s/p appy    . VASECTOMY       Current Outpatient Prescriptions  Medication Sig Dispense Refill  . acyclovir (ZOVIRAX) 400 MG tablet 2 tablets twice a day for 5 days as needed for flare of fever blister    . aspirin 81 MG tablet Take 81 mg by mouth daily.    .  fexofenadine (ALLEGRA) 180 MG tablet Take 180 mg by mouth daily. allergies    . fluoruracil (CARAC) 0.5 % cream Apply to skin twice daily for 2 weeks as needed for pre-cancerous skin lesions 30 g 0  . FLUZONE HIGH-DOSE 0.5 ML SUSY   0  . Glucosamine-Chondroit-Vit C-Mn (GLUCOSAMINE-CHONDROITIN) CAPS Take 2 capsules by mouth daily.     . metoprolol succinate (TOPROL-XL) 100 MG 24 hr tablet 1 tablet po daily for afib episodes    . mometasone (NASONEX) 50 MCG/ACT nasal spray Place 2 sprays into the nose as needed (allergies).     . Multiple Vitamins-Minerals (MULTIVITAL) tablet Take 1 tablet by mouth daily.       No current facility-administered medications for this visit.     Allergies:   Patient has no known allergies.    Social History:  The patient  reports that he has never smoked. He has never used smokeless tobacco. He reports that he drinks alcohol. He reports that he does not use drugs.   Family History:  The patient's family history includes Atrial fibrillation  in his father; Heart attack in his brother; Parkinsonism in his father; Stroke in his mother.    ROS:  Please see the history of present illness.   All other systems are reviewed and negative.    PHYSICAL EXAM: VS:  BP 140/90 (BP Location: Right Arm, Patient Position: Sitting, Cuff Size: Normal)   Pulse 72   Ht 5\' 10"  (1.778 m)   Wt 202 lb 12.8 oz (92 kg)   BMI 29.10 kg/m  , BMI Body mass index is 29.1 kg/m. GEN: Well nourished, well developed, in no acute distress  HEENT: normal  Neck: no JVD, carotid bruits, or masses Cardiac: RRR; no murmurs, rubs, or gallops,no edema  Respiratory:  clear to auscultation bilaterally, normal work of breathing GI: soft, nontender, nondistended, + BS MS: no deformity or atrophy  Skin: warm and dry, no rash Neuro:  Strength and sensation are intact Psych: euthymic mood, full affect   EKG:   The ekg ordered today demonstrates Normal ECG   Recent Labs: 09/15/2015: Creatinine,  Ser 0.90   Lipid Panel No results found for: CHOL, TRIG, HDL, CHOLHDL, VLDL, LDLCALC, LDLDIRECT   Other studies Reviewed: Additional studies/ records that were reviewed today with results demonstrating: prior AFib and Aflutter ablation.   ASSESSMENT AND PLAN:  1.  AFib:  Continue Toprol XL Tablet Extended Release 24 Hour, 100 MG, 1 tablet, Orally, Once a day as needed, 30 days, 30, Refills 6  ;  He has not needed this medicine.  Maintaining NSR. No problems with exercise.  Has not used any toprol. No sx of angina.  Continue regular exercise.   Lifeline screening in 2013. Mild RICA plaque. Increased Right ABI.  Continue aggressive preventive therapy.            Current medicines are reviewed at length with the patient today.  The patient concerns regarding his medicines were addressed.  The following changes have been made:  No change  Labs/ tests ordered today include: checked by PMD- will obtain those results No orders of the defined types were placed in this encounter.   Recommend 150 minutes/week of aerobic exercise Low fat, low carb, high fiber diet recommended  Disposition:   FU in  1year   Signed, Lance MussJayadeep Danyela Posas, MD  02/15/2016 12:11 PM    [facility] [address], [place], [place]  [address] Phone: [phone]; Fax: [phone]

## 2016-02-15 ENCOUNTER — Ambulatory Visit (INDEPENDENT_AMBULATORY_CARE_PROVIDER_SITE_OTHER): Payer: Medicare Other | Admitting: Interventional Cardiology

## 2016-02-15 ENCOUNTER — Encounter: Payer: Self-pay | Admitting: Interventional Cardiology

## 2016-02-15 VITALS — BP 140/90 | HR 72 | Ht 70.0 in | Wt 202.8 lb

## 2016-02-15 DIAGNOSIS — I4891 Unspecified atrial fibrillation: Secondary | ICD-10-CM | POA: Diagnosis not present

## 2016-02-15 NOTE — Patient Instructions (Signed)

## 2016-02-28 NOTE — Progress Notes (Signed)
Patient ID: Frederick Glover, male   DOB: 09/14/1940, 75 y.o.   MRN: [id number]     Cardiology Office Note   Date:  02/29/2016   ID:  Frederick Glover, DOB 04/04/1940, MRN [id number]  PCP:  Lillia MountainGRIFFIN,JOHN JOSEPH, MD    No chief complaint on file. f/u AFib, elevated BP   Wt Readings from Last 3 Encounters:  02/29/16 203 lb 12.8 oz (92.4 kg)  02/15/16 202 lb 12.8 oz (92 kg)  09/15/15 203 lb (92.1 kg)       History of Present Illness: Frederick GripStuart T Sendejo is a 75 y.o. male  who has had PAF. He saw Dr. Johney FrameAllred who recommended that he come off of coumadin several years ago. He had an AFib ablation in 2011. Atrial Fibrillation F/U:  Denies : Chest pain. Dizziness. Syncope. Leg edema.  Orthopnea.  Palpitations.  Shortness of breath.    Has felt well since his last visit.  Not checking BP at home. Never feels heart out of rhythm. He has not used any metoprolol, which he has for prn use if he feels AFib.   Exercises 3-4x/week, about 30-45 minutes. Walking. He is still active during the day.  [place] lot of hiking.   [place] [facility] [facility]. No problems with exertion.    He has had several blood pressure readings in the 140s. The highest was in the 160s. Diastolics have also been in the high 80s and low 90s at times. He denies any symptoms such as headaches or visual changes.      Past Medical History:  Diagnosis Date  . Allergic rhinitis   . Atrial fibrillation (HCC)    atrial flutter PVI/CTI ablation 7/11  . BPH (benign prostatic hyperplasia)   . Chest pain   . DJD (degenerative joint disease)   . Hearing loss   . Herpes simplex    , left butt  . Plantar fasciitis   . Rheumatic fever    denies h/o   . Tinnitus     Past Surgical History:  Procedure Laterality Date  . afib/atrial flutter ablation  7/11   afib ablation by JA  . arthroscopic knee surgery    . HERNIA REPAIR    . LAMINECTOMY    . s/p appy    . VASECTOMY       Current  Outpatient Prescriptions  Medication Sig Dispense Refill  . acyclovir (ZOVIRAX) 400 MG tablet 2 tablets twice a day for 5 days as needed for flare of fever blister    . aspirin 81 MG tablet Take 81 mg by mouth daily.    . fexofenadine (ALLEGRA) 180 MG tablet Take 180 mg by mouth daily. allergies    . fluoruracil (CARAC) 0.5 % cream Apply to skin twice daily for 2 weeks as needed for pre-cancerous skin lesions 30 g 0  . FLUZONE HIGH-DOSE 0.5 ML SUSY   0  . Glucosamine-Chondroit-Vit C-Mn (GLUCOSAMINE-CHONDROITIN) CAPS Take 2 capsules by mouth daily.     . metoprolol succinate (TOPROL-XL) 100 MG 24 hr tablet 1 tablet po daily for afib episodes    . mometasone (NASONEX) 50 MCG/ACT nasal spray Place 2 sprays into the nose as needed (allergies).     . Multiple Vitamins-Minerals (MULTIVITAL) tablet Take 1 tablet by mouth daily.       No current facility-administered medications for this visit.     Allergies:   Patient has no known allergies.  Social History:  The patient  reports that he has never smoked. He has never used smokeless tobacco. He reports that he drinks alcohol. He reports that he does not use drugs.   Family History:  The patient's family history includes Atrial fibrillation in his father; Heart attack in his brother; Parkinsonism in his father; Stroke in his mother.    ROS:  Please see the history of present illness.   All other systems are reviewed and negative.    PHYSICAL EXAM: VS:  BP 140/84   Pulse 75   Ht 5\' 10"  (1.778 m)   Wt 203 lb 12.8 oz (92.4 kg)   BMI 29.24 kg/m  , BMI Body mass index is 29.24 kg/m. GEN: Well nourished, well developed, in no acute distress  HEENT: normal  Neck: no JVD, carotid bruits, or masses Cardiac: RRR; no murmurs, rubs, or gallops,no edema  Respiratory:  clear to auscultation bilaterally, normal work of breathing GI: soft, nontender, nondistended, + BS MS: no deformity or atrophy  Skin: warm and dry, no rash Neuro:  Strength and  sensation are intact Psych: euthymic mood, full affect   EKG:   The ekg ordered today demonstrates Normal ECG   Recent Labs: 09/15/2015: Creatinine, Ser 0.90   Lipid Panel No results found for: CHOL, TRIG, HDL, CHOLHDL, VLDL, LDLCALC, LDLDIRECT   Other studies Reviewed: Additional studies/ records that were reviewed today with results demonstrating: prior AFib and Aflutter ablation.   ASSESSMENT AND PLAN:  1.  AFib:  Continue Toprol XL Tablet Extended Release 24 Hour, 100 MG, 1 tablet, Orally, Once a day as needed, 30 days, 30, Refills 6  ;  He has not needed this medicine.  Maintaining NSR. No problems with exercise.  Has not used any toprol. No sx of angina.  Continue regular exercise.   Lifeline screening in 2013. Mild RICA plaque. Increased Right ABI.  Continue aggressive preventive therapy.        2. Some elevated blood pressure readings. He has decreased his exercise frequency. He has gained a little bit of weight. We talked about trying to change his lifestyle habits. Will give him a few months to try to increase exercise and lose 5-10 pounds. I think if he is able to do this, his blood pressure will come down. If his blood pressure stays elevated, would start low-dose ACE inhibitor. He would like to avoid adding a medication if possible.    Current medicines are reviewed at length with the patient today.  The patient concerns regarding his medicines were addressed.  The following changes have been made:  No change  Labs/ tests ordered today include: checked by PMD- will obtain those results No orders of the defined types were placed in this encounter.   Recommend 150 minutes/week of aerobic exercise Low fat, low carb, high fiber diet recommended  Disposition:   FU in  3 months   Signed, Lance MussJayadeep Varanasi, MD  03/01/2016 8:56 AM    [facility] [address], [place], [place]  [address] Phone: [phone](336) [phone]; Fax: [phone]

## 2016-02-29 ENCOUNTER — Ambulatory Visit (INDEPENDENT_AMBULATORY_CARE_PROVIDER_SITE_OTHER): Payer: Medicare Other | Admitting: Interventional Cardiology

## 2016-02-29 ENCOUNTER — Encounter: Payer: Self-pay | Admitting: Interventional Cardiology

## 2016-02-29 VITALS — BP 140/84 | HR 75 | Ht 70.0 in | Wt 203.8 lb

## 2016-02-29 DIAGNOSIS — I480 Paroxysmal atrial fibrillation: Secondary | ICD-10-CM

## 2016-02-29 DIAGNOSIS — R030 Elevated blood-pressure reading, without diagnosis of hypertension: Secondary | ICD-10-CM | POA: Diagnosis not present

## 2016-02-29 NOTE — Patient Instructions (Addendum)
**Note De-Identified Lexany Belknap Obfuscation** Medication Instructions:  Same-no changes  Labwork: None  Testing/Procedures: None  Follow-Up: In 3 months   Any Other Special Instructions Will Be Listed Below (If Applicable). Increase exercise    If you need a refill on your cardiac medications before your next appointment, please call your pharmacy.

## 2016-03-01 DIAGNOSIS — R030 Elevated blood-pressure reading, without diagnosis of hypertension: Secondary | ICD-10-CM | POA: Insufficient documentation

## 2016-03-24 ENCOUNTER — Encounter: Payer: Self-pay | Admitting: Interventional Cardiology

## 2016-05-14 NOTE — Progress Notes (Signed)
Patient ID: Frederick Glover, male   DOB: [birth year], 76 y.o.   MRN: [id number]     Cardiology Office Note   Date:  02/29/2016   ID:  Frederick Glover, DOB 14-Apr-1940, MRN [id number]  PCP:  Lillia Mountain, MD    No chief complaint on file. f/u AFib, elevated BP   Wt Readings from Last 3 Encounters:  05/15/16 197 lb 12.8 oz (89.7 kg)  02/29/16 203 lb 12.8 oz (92.4 kg)  02/15/16 202 lb 12.8 oz (92 kg)       History of Present Illness: Frederick Glover is a 76 y.o. male  who has had PAF. He saw Dr. Johney Frame who recommended that he come off of coumadin several years ago. He had an AFib ablation in 2011. Atrial Fibrillation F/U:  Denies : Chest pain. Dizziness. Syncope. Leg edema.  Orthopnea.  Palpitations.  Shortness of breath.    Has felt well since his last visit.   Never feels heart out of rhythm. He has not used any metoprolol, which he has for prn use if he feels AFib.   Exercise has decreased over the past 6 months and as noted below, BP increased. He feels increased stress from the Trump presidency.   He has had several blood pressure readings in the 140s. The highest was in the 160s. Diastolics have also been in the high 80s and low 90s at times. He denies any symptoms such as headaches or visual changes.  He has tried to be more active and check his BP at home more regularly.  Readings have been better since he lost weight through diet control.  He stopped eating late at night.  He tries to fast for at least 10 hours a day.       Past Medical History:  Diagnosis Date  . Allergic rhinitis   . Atrial fibrillation (HCC)    atrial flutter PVI/CTI ablation 7/11  . BPH (benign prostatic hyperplasia)   . Chest pain   . DJD (degenerative joint disease)   . Hearing loss   . Herpes simplex    , left butt  . Plantar fasciitis   . Rheumatic fever    denies h/o   . Tinnitus     Past Surgical History:  Procedure Laterality Date  . afib/atrial flutter ablation   7/11   afib ablation by JA  . arthroscopic knee surgery    . HERNIA REPAIR    . LAMINECTOMY    . s/p appy    . VASECTOMY       Current Outpatient Prescriptions  Medication Sig Dispense Refill  . acyclovir (ZOVIRAX) 400 MG tablet 2 tablets twice a day for 5 days as needed for flare of fever blister    . aspirin 81 MG tablet Take 81 mg by mouth daily.    . fexofenadine (ALLEGRA) 180 MG tablet Take 180 mg by mouth daily. allergies    . fluoruracil (CARAC) 0.5 % cream Apply to skin twice daily for 2 weeks as needed for pre-cancerous skin lesions 30 g 0  . FLUZONE HIGH-DOSE 0.5 ML SUSY   0  . Glucosamine-Chondroit-Vit C-Mn (GLUCOSAMINE-CHONDROITIN) CAPS Take 2 capsules by mouth daily.     . metoprolol succinate (TOPROL-XL) 100 MG 24 hr tablet 1 tablet po daily for afib episodes    . mometasone (NASONEX) 50 MCG/ACT nasal spray Place 2 sprays into the nose as needed (allergies).     . Multiple Vitamins-Minerals (MULTIVITAL) tablet Take 1 tablet by  mouth daily.       No current facility-administered medications for this visit.     Allergies:   Patient has no known allergies.    Social History:  The patient  reports that he has never smoked. He has never used smokeless tobacco. He reports that he drinks alcohol. He reports that he does not use drugs.   Family History:  The patient's family history includes Atrial fibrillation in his father; Heart attack in his brother; Parkinsonism in his father; Stroke in his mother.    ROS:  Please see the history of present illness.   All other systems are reviewed and negative.    PHYSICAL EXAM: VS:  BP 130/90 (BP Location: Left Arm, Patient Position: Sitting, Cuff Size: Normal)   Pulse 67   Ht 5\' 10"  (1.778 m)   Wt 197 lb 12.8 oz (89.7 kg)   SpO2 97%   BMI 28.38 kg/m  , BMI Body mass index is 28.38 kg/m. GEN: Well nourished, well developed, in no acute distress  HEENT: normal  Neck: no JVD, carotid bruits, or masses Cardiac: RRR; no  murmurs, rubs, or gallops,no edema  Respiratory:  clear to auscultation bilaterally, normal work of breathing GI: soft, nontender, nondistended, + BS MS: no deformity or atrophy  Skin: warm and dry, no rash Neuro:  Strength and sensation are intact Psych: euthymic mood, full affect   EKG:   The ekg ordered today demonstrates Normal ECG   Recent Labs: 09/15/2015: Creatinine, Ser 0.90   Lipid Panel No results found for: CHOL, TRIG, HDL, CHOLHDL, VLDL, LDLCALC, LDLDIRECT   Other studies Reviewed: Additional studies/ records that were reviewed today with results demonstrating: prior AFib and Aflutter ablation.   ASSESSMENT AND PLAN:  1.  AFib:  Continue Toprol XL Tablet Extended Release 24 Hour, 100 MG, 1 tablet, Orally, Once a day as needed, 30 days, 30, Refills 6  ;  He has not needed this medicine.  Maintaining NSR. No problems with exercise.  Has not used any toprol. No sx of angina.  Continue regular exercise.   Lifeline screening in 2013. Mild RICA plaque. Increased Right ABI.  Continue aggressive preventive therapy.        2. Some elevated blood pressure readings in late 2017 and early 2018. He had decreased his exercise frequency. He has gained a little bit of weight. We talked about trying to change his lifestyle habits. After losing 6 lbs, his blood pressure has come down.   If his blood pressure increases again, would start low-dose ACE inhibitor. He would like to avoid adding a medication if possible.    Current medicines are reviewed at length with the patient today.  The patient concerns regarding his medicines were addressed.  The following changes have been made:  No change  Labs/ tests ordered today include: checked by PMD- will obtain those results No orders of the defined types were placed in this encounter.   Recommend 150 minutes/week of aerobic exercise Low fat, low carb, high fiber diet recommended  Disposition:   FU in 12  months   Signed, [name], MD  05/15/2016 1:55 PM    [facility] [address], [place], [place]  [address] Phone: [phone](336) [phone]; Fax: [phone]

## 2016-05-15 ENCOUNTER — Ambulatory Visit (INDEPENDENT_AMBULATORY_CARE_PROVIDER_SITE_OTHER): Payer: Medicare Other | Admitting: Interventional Cardiology

## 2016-05-15 ENCOUNTER — Encounter: Payer: Self-pay | Admitting: Interventional Cardiology

## 2016-05-15 VITALS — BP 130/90 | HR 67 | Ht 70.0 in | Wt 197.8 lb

## 2016-05-15 DIAGNOSIS — R030 Elevated blood-pressure reading, without diagnosis of hypertension: Secondary | ICD-10-CM

## 2016-05-15 DIAGNOSIS — I480 Paroxysmal atrial fibrillation: Secondary | ICD-10-CM

## 2016-05-15 NOTE — Patient Instructions (Signed)
**Note De-identified Rotha Cassels Obfuscation** Medication Instructions:  Same-no changes  Labwork: None  Testing/Procedures: None  Follow-Up: Your physician wants you to follow-up in: 1 year. You will receive a reminder letter in the mail two months in advance. If you don't receive a letter, please call our office to schedule the follow-up appointment.      If you need a refill on your cardiac medications before your next appointment, please call your pharmacy.   

## 2017-06-05 ENCOUNTER — Ambulatory Visit: Payer: Medicare Other | Admitting: Interventional Cardiology

## 2017-06-05 ENCOUNTER — Encounter: Payer: Self-pay | Admitting: Interventional Cardiology

## 2017-06-05 VITALS — BP 120/90 | HR 67 | Ht 70.0 in | Wt 204.2 lb

## 2017-06-05 DIAGNOSIS — I480 Paroxysmal atrial fibrillation: Secondary | ICD-10-CM

## 2017-06-05 DIAGNOSIS — I6521 Occlusion and stenosis of right carotid artery: Secondary | ICD-10-CM

## 2017-06-05 DIAGNOSIS — R030 Elevated blood-pressure reading, without diagnosis of hypertension: Secondary | ICD-10-CM

## 2017-06-05 NOTE — Progress Notes (Signed)
Cardiology Office Note   Date:  06/05/2017   ID:  Frederick GripStuart T Glover, DOB 11/27/1940, MRN [id number]  PCP:  Kirby FunkGriffin, John, MD    No chief complaint on file.  AFib  Wt Readings from Last 3 Encounters:  06/05/17 204 lb 3.2 oz (92.6 kg)  05/15/16 197 lb 12.8 oz (89.7 kg)  02/29/16 203 lb 12.8 oz (92.4 kg)       History of Present Illness: Frederick Glover is a 77 y.o. male  who has had PAF. He saw Dr. Johney FrameAllred who recommended that he come off of coumadin several years ago. He had an AFib ablation in 2011.  Lifeline screening in 2013. Mild RICA plaque. Increased Right ABI.    He has had borderline BP in the past.  He has tried to be more active and check his BP at home more regularly.  Readings have been better since he lost weight through diet control.  He stopped eating late at night.  He tries to fast for at least 10 hours a day.    His grandson passed away in [place] in 9/18, while studying abroad.  He died of methanol poisoning.   Denies : Chest pain. Dizziness. Leg edema. Nitroglycerin use. Orthopnea. Palpitations. Paroxysmal nocturnal dyspnea. Shortness of breath. Syncope.   Walks 1 mile several times a week.      Past Medical History:  Diagnosis Date  . Allergic rhinitis   . Atrial fibrillation (HCC)    atrial flutter PVI/CTI ablation 7/11  . BPH (benign prostatic hyperplasia)   . Chest pain   . DJD (degenerative joint disease)   . Hearing loss   . Herpes simplex    , left butt  . Plantar fasciitis   . Rheumatic fever    denies h/o   . Tinnitus     Past Surgical History:  Procedure Laterality Date  . afib/atrial flutter ablation  7/11   afib ablation by JA  . arthroscopic knee surgery    . HERNIA REPAIR    . LAMINECTOMY    . s/p appy    . VASECTOMY       Current Outpatient Medications  Medication Sig Dispense Refill  . acyclovir (ZOVIRAX) 400 MG tablet 2 tablets twice a day for 5 days as needed for flare of fever blister    . aspirin 81 MG  tablet Take 81 mg by mouth daily.    . fexofenadine (ALLEGRA) 180 MG tablet Take 180 mg by mouth daily. allergies    . fluoruracil (CARAC) 0.5 % cream Apply to skin twice daily for 2 weeks as needed for pre-cancerous skin lesions 30 g 0  . FLUZONE HIGH-DOSE 0.5 ML SUSY   0  . Glucosamine-Chondroit-Vit C-Mn (GLUCOSAMINE-CHONDROITIN) CAPS Take 2 capsules by mouth daily.     . mometasone (NASONEX) 50 MCG/ACT nasal spray Place 2 sprays into the nose as needed (allergies).     . Multiple Vitamins-Minerals (MULTIVITAL) tablet Take 1 tablet by mouth daily.       No current facility-administered medications for this visit.     Allergies:   Patient has no known allergies.    Social History:  The patient  reports that he has never smoked. He has never used smokeless tobacco. He reports that he drinks alcohol. He reports that he does not use drugs.   Family History:  The patient's family history includes Atrial fibrillation in his father; Heart attack in his brother; Parkinsonism in his father; Stroke in his  mother.    ROS:  Please see the history of present illness.   Otherwise, review of systems are positive for recent broken tooth.   All other systems are reviewed and negative.    PHYSICAL EXAM: VS:  BP 120/90   Pulse 67   Ht 5\' 10"  (1.778 m)   Wt 204 lb 3.2 oz (92.6 kg)   SpO2 95%   BMI 29.30 kg/m  , BMI Body mass index is 29.3 kg/m. GEN: Well nourished, well developed, in no acute distress  HEENT: normal  Neck: no JVD, carotid bruits, or masses Cardiac: RRR; no murmurs, rubs, or gallops,no edema  Respiratory:  clear to auscultation bilaterally, normal work of breathing GI: soft, nontender, nondistended, + BS MS: no deformity or atrophy  Skin: warm and dry, no rash Neuro:  Strength and sensation are intact Psych: euthymic mood, full affect   EKG:   The ekg ordered today demonstrates NSR, no ST changes   Recent Labs: No results found for requested labs within last 8760 hours.     Lipid Panel No results found for: CHOL, TRIG, HDL, CHOLHDL, VLDL, LDLCALC, LDLDIRECT   Other studies Reviewed: Additional studies/ records that were reviewed today with results demonstrating: .   ASSESSMENT AND PLAN:  1. AFib: No symptoms of atrial fibrillation.  He has not used any metoprolol as needed.  He is wondering whether he should stay on aspirin given his history.  He stopped it given recent news regarding safety concerns with aspirin. 2. Blood pressure has been improved.  He is trying to stay active.  I encouraged him to try to increase his activity to the target listed below.  Continue to try to eat healthy and maintain weight loss. 3. We spoke about the tragedy of the death of his grandson.  He still feels that he is grieving.  His daughter and wife are still both in counseling.  He seems to have coped well with a terrible situation. 4. Mild carotid disease noted in 2013.  No bruit on exam.  He will let us know if he is interested in any further imaging.  Today, he declined.   Current medicines are reviewed at length with the patient today.  The patient concerns regarding his medicines were addressed.  The following changes have been made:  No change  Labs/ tests ordered today include:   Orders Placed This Encounter  Procedures  . EKG 12-Lead    Recommend 150 minutes/week of aerobic exercise Low fat, low carb, high fiber diet recommended  Disposition:   FU in 1 year   Signed, Lance Muss, MD  06/05/2017 2:07 PM    [facility] [address] [place], [place], [place]  [address] Phone: [phone]; Fax: [phone]

## 2017-06-05 NOTE — Patient Instructions (Signed)

## 2017-07-02 ENCOUNTER — Ambulatory Visit
Admission: RE | Admit: 2017-07-02 | Discharge: 2017-07-02 | Disposition: A | Discharge status: Home / Self Care | Payer: Medicare Other | Source: Ambulatory Visit | Attending: Internal Medicine | Admitting: Internal Medicine

## 2017-07-02 ENCOUNTER — Other Ambulatory Visit: Payer: Self-pay | Admitting: Internal Medicine

## 2017-07-02 DIAGNOSIS — R0789 Other chest pain: Secondary | ICD-10-CM

## 2017-11-27 ENCOUNTER — Other Ambulatory Visit: Payer: Self-pay | Admitting: Urology

## 2017-11-27 DIAGNOSIS — R972 Elevated prostate specific antigen [PSA]: Secondary | ICD-10-CM

## 2017-12-13 ENCOUNTER — Ambulatory Visit
Admission: RE | Admit: 2017-12-13 | Discharge: 2017-12-13 | Disposition: A | Discharge status: Home / Self Care | Payer: Medicare Other | Source: Ambulatory Visit | Attending: Urology | Admitting: Urology

## 2017-12-13 DIAGNOSIS — R972 Elevated prostate specific antigen [PSA]: Secondary | ICD-10-CM

## 2017-12-13 MED ORDER — GADOBENATE DIMEGLUMINE 529 MG/ML IV SOLN
19.0000 mL | Freq: Once | INTRAVENOUS | Status: AC | PRN
  Administered 2017-12-13: 19 mL via INTRAVENOUS

## 2018-06-14 ENCOUNTER — Ambulatory Visit: Payer: Medicare Other | Admitting: Interventional Cardiology

## 2018-11-04 IMAGING — MR MR PROSTATE WO/W CM
56 series · 56 of 56 positions shown · IV contrast (MULTIHANCE)
Comparison: None.

CLINICAL DATA: Elevated PSA level, 9.2 on 11/19/2017. Prior biopsy
of the prostate on 05/22/2011 was negative.

EXAM:
MR PROSTATE WITHOUT AND WITH CONTRAST
TECHNIQUE: Multiplanar multisequence MRI images were obtained of the pelvis
centered about the prostate. Pre and post contrast images were
obtained.
CONTRAST:  19mL MULTIHANCE GADOBENATE DIMEGLUMINE 529 MG/ML IV SOLN
Creatinine was obtained on site at [HOSPITAL] at [HOSPITAL].
Results: Creatinine 0.8 mg/dL.

[Series 3: T1 · axial · 8.0mm · 1.06mm/px · 1 of 28 slices shown (1 of 2)]
[im 1/28]
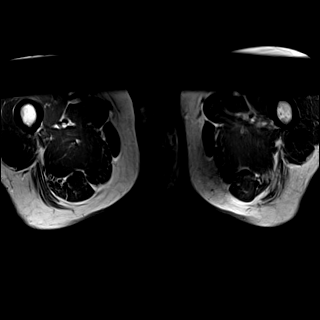

[Series 4: bSSFP fat-sat · axial · 8.0mm · 0.74mm/px · 1 of 28 slices shown]
[im 1/28]
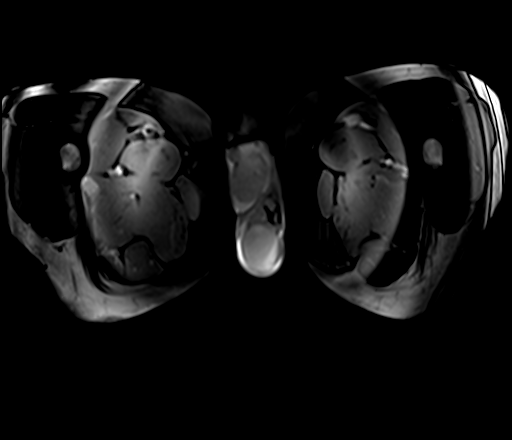

[Series 5: T2 · sagittal · 3.5mm · 0.56mm/px · 1 of 39 slices shown (1 of 4)]
[im 1/39]
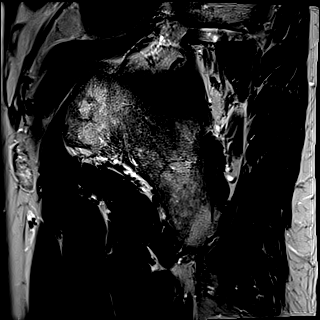

[Series 6: T1 · axial · 3.0mm · 0.31mm/px · 1 of 24 slices shown (2 of 2)]
[im 1/24]
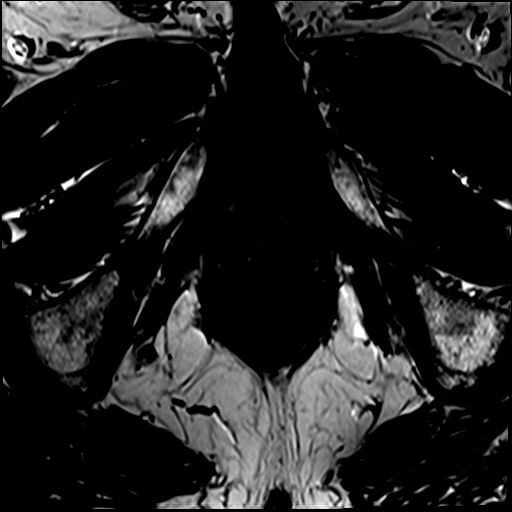

[Series 7: T2 · axial · 3.5mm · 0.56mm/px · 1 of 23 slices shown (2 of 4)]
[im 1/23]
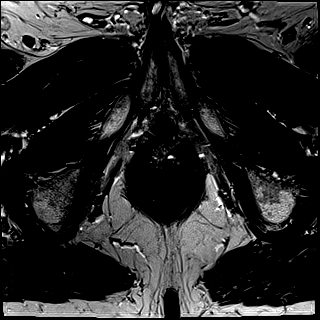

[Series 8: T2 · axial · 1.0mm · 1.04mm/px · 1 of 80 slices shown (3 of 4)]
[im 1/80]
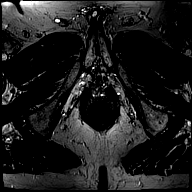

[Series 9: T2 · coronal · 3.5mm · 0.56mm/px · 1 of 23 slices shown (4 of 4)]
[im 1/23]
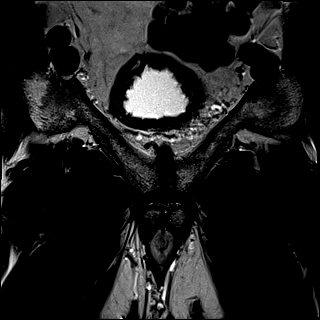

[Series 10: DWI · axial · 3.5mm · 1.56mm/px · 1 of 67 slices shown (1 of 2)]
[im 1/67]
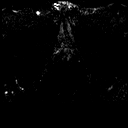

[Series 11: DWI · axial · 3.5mm · 1.56mm/px · 1 of 22 slices shown (2 of 2)]
[im 1/22]
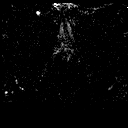

[Series 12: pre t1_twist_tra_dyn_ttc=5.8s · axial · non-contrast · 3.5mm · 0.83mm/px · 1 of 22 slices shown]
[im 1/22]
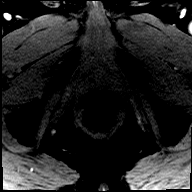

[Series 13: post t1_twist_tra_dyn-copy center · axial · 3.5mm · 0.83mm/px · 1 of 22 slices shown (1 of 24)]
[im 1/22]
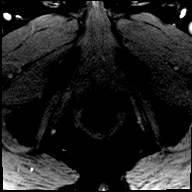

[Series 14: post t1_twist_tra_dyn-copy center · axial · 3.5mm · 0.83mm/px · 1 of 22 slices shown (2 of 24)]
[im 1/22]
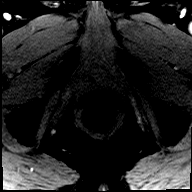

[Series 15: post t1_twist_tra_dyn-copy cent_sub_ttc=(id) · axial · 3.5mm · 0.83mm/px · 1 of 18 slices shown (1 of 22)]
[im 1/18]
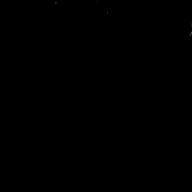

[Series 16: post t1_twist_tra_dyn-copy center · axial · 3.5mm · 0.83mm/px · 1 of 22 slices shown (3 of 24)]
[im 1/22]
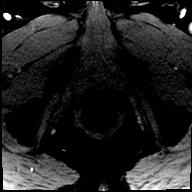

[Series 17: post t1_twist_tra_dyn-copy cent_sub_ttc=(id) · axial · 3.5mm · 0.83mm/px · 1 of 18 slices shown (2 of 22)]
[im 1/18]
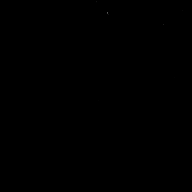

[Series 18: post t1_twist_tra_dyn-copy center · axial · 3.5mm · 0.83mm/px · 1 of 22 slices shown (4 of 24)]
[im 1/22]
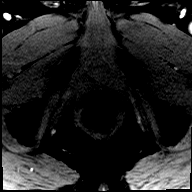

[Series 19: post t1_twist_tra_dyn-copy cent_sub_ttc=(id) · axial · 3.5mm · 0.83mm/px · 1 of 14 slices shown (3 of 22)]
[im 1/14]
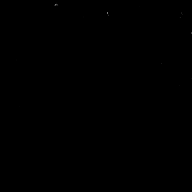

[Series 20: post t1_twist_tra_dyn-copy center · axial · 3.5mm · 0.83mm/px · 1 of 22 slices shown (5 of 24)]
[im 1/22]
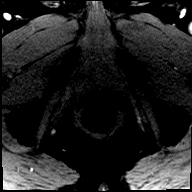

[Series 21: post t1_twist_tra_dyn-copy cent_sub_ttc=(id) · axial · 3.5mm · 0.83mm/px · 1 of 19 slices shown (4 of 22)]
[im 1/19]
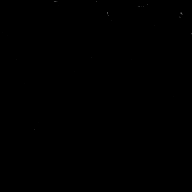

[Series 22: post t1_twist_tra_dyn-copy center · axial · 3.5mm · 0.83mm/px · 1 of 22 slices shown (6 of 24)]
[im 1/22]
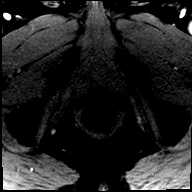

[Series 23: post t1_twist_tra_dyn-copy cent_sub_ttc=(id) · axial · 3.5mm · 0.83mm/px · 1 of 19 slices shown (5 of 22)]
[im 1/19]
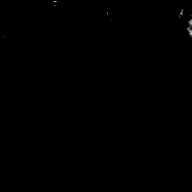

[Series 24: post t1_twist_tra_dyn-copy center · axial · 3.5mm · 0.83mm/px · 1 of 22 slices shown (7 of 24)]
[im 1/22]
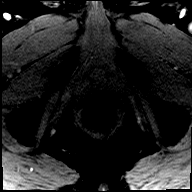

[Series 25: post t1_twist_tra_dyn-copy cent_sub_ttc=(id) · axial · 3.5mm · 0.83mm/px · 1 of 22 slices shown (6 of 22)]
[im 1/22]
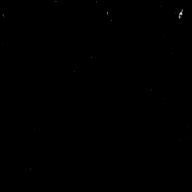

[Series 26: post t1_twist_tra_dyn-copy center · axial · 3.5mm · 0.83mm/px · 1 of 22 slices shown (8 of 24)]
[im 1/22]
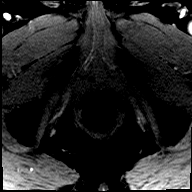

[Series 27: post t1_twist_tra_dyn-copy cent_sub_ttc=(id) · axial · 3.5mm · 0.83mm/px · 1 of 22 slices shown (7 of 22)]
[im 1/22]
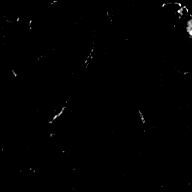

[Series 28: post t1_twist_tra_dyn-copy center · axial · 3.5mm · 0.83mm/px · 1 of 22 slices shown (9 of 24)]
[im 1/22]
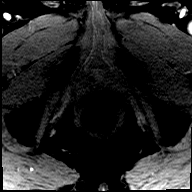

[Series 29: post t1_twist_tra_dyn-copy cent_sub_ttc=(id) · axial · 3.5mm · 0.83mm/px · 1 of 22 slices shown (8 of 22)]
[im 1/22]
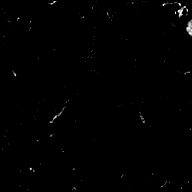

[Series 30: post t1_twist_tra_dyn-copy center · axial · 3.5mm · 0.83mm/px · 1 of 22 slices shown (10 of 24)]
[im 1/22]
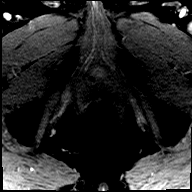

[Series 31: post t1_twist_tra_dyn-copy cent_sub_ttc=(id) · axial · 3.5mm · 0.83mm/px · 1 of 22 slices shown (9 of 22)]
[im 1/22]
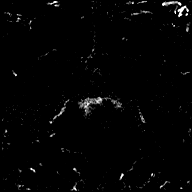

[Series 32: post t1_twist_tra_dyn-copy center · axial · 3.5mm · 0.83mm/px · 1 of 22 slices shown (11 of 24)]
[im 1/22]
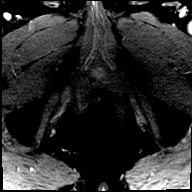

[Series 33: post t1_twist_tra_dyn-copy cent_sub_ttc=(id) · axial · 3.5mm · 0.83mm/px · 1 of 22 slices shown (10 of 22)]
[im 1/22]
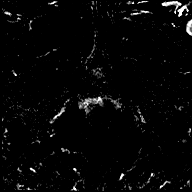

[Series 34: post t1_twist_tra_dyn-copy center · axial · 3.5mm · 0.83mm/px · 1 of 22 slices shown (12 of 24)]
[im 1/22]
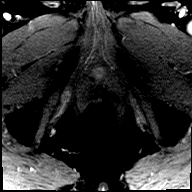

[Series 35: post t1_twist_tra_dyn-copy cent_sub_ttc=(id) · axial · 3.5mm · 0.83mm/px · 1 of 22 slices shown (11 of 22)]
[im 1/22]
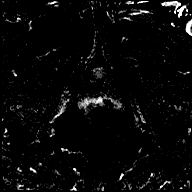

[Series 36: post t1_twist_tra_dyn-copy center · axial · 3.5mm · 0.83mm/px · 1 of 22 slices shown (13 of 24)]
[im 1/22]
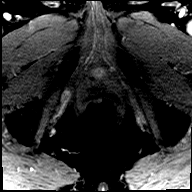

[Series 37: post t1_twist_tra_dyn-copy cent_sub_ttc=(id) · axial · 3.5mm · 0.83mm/px · 1 of 22 slices shown (12 of 22)]
[im 1/22]
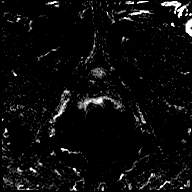

[Series 38: post t1_twist_tra_dyn-copy center · axial · 3.5mm · 0.83mm/px · 1 of 22 slices shown (14 of 24)]
[im 1/22]
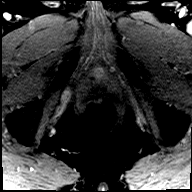

[Series 39: post t1_twist_tra_dyn-copy cent_sub_ttc=(id) · axial · 3.5mm · 0.83mm/px · 1 of 22 slices shown (13 of 22)]
[im 1/22]
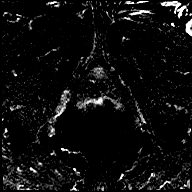

[Series 40: post t1_twist_tra_dyn-copy center · axial · 3.5mm · 0.83mm/px · 1 of 22 slices shown (15 of 24)]
[im 1/22]
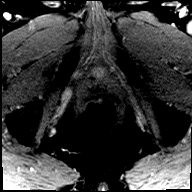

[Series 41: post t1_twist_tra_dyn-copy cent_sub_ttc=(id) · axial · 3.5mm · 0.83mm/px · 1 of 22 slices shown (14 of 22)]
[im 1/22]
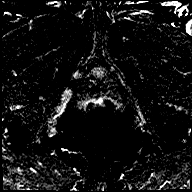

[Series 42: post t1_twist_tra_dyn-copy center · axial · 3.5mm · 0.83mm/px · 1 of 22 slices shown (16 of 24)]
[im 1/22]
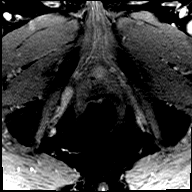

[Series 43: post t1_twist_tra_dyn-copy cent_sub_ttc=(id) · axial · 3.5mm · 0.83mm/px · 1 of 22 slices shown (15 of 22)]
[im 1/22]
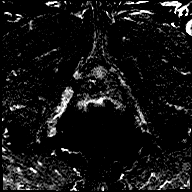

[Series 44: post t1_twist_tra_dyn-copy center · axial · 3.5mm · 0.83mm/px · 1 of 22 slices shown (17 of 24)]
[im 1/22]
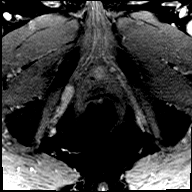

[Series 45: post t1_twist_tra_dyn-copy cent_sub_ttc=(id) · axial · 3.5mm · 0.83mm/px · 1 of 22 slices shown (16 of 22)]
[im 1/22]
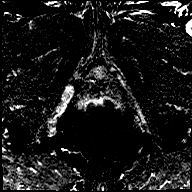

[Series 46: post t1_twist_tra_dyn-copy center · axial · 3.5mm · 0.83mm/px · 1 of 22 slices shown (18 of 24)]
[im 1/22]
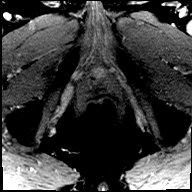

[Series 47: post t1_twist_tra_dyn-copy cent_sub_ttc=(id) · axial · 3.5mm · 0.83mm/px · 1 of 22 slices shown (17 of 22)]
[im 1/22]
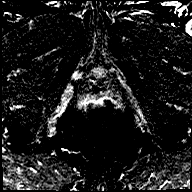

[Series 48: post t1_twist_tra_dyn-copy center · axial · 3.5mm · 0.83mm/px · 1 of 22 slices shown (19 of 24)]
[im 1/22]
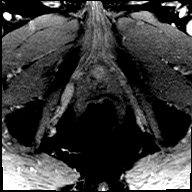

[Series 49: post t1_twist_tra_dyn-copy cent_sub_ttc=(id) · axial · 3.5mm · 0.83mm/px · 1 of 22 slices shown (18 of 22)]
[im 1/22]
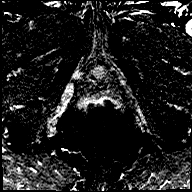

[Series 50: post t1_twist_tra_dyn-copy center · axial · 3.5mm · 0.83mm/px · 1 of 22 slices shown (20 of 24)]
[im 1/22]
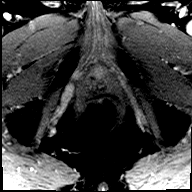

[Series 51: post t1_twist_tra_dyn-copy cent_sub_ttc=(id) · axial · 3.5mm · 0.83mm/px · 1 of 22 slices shown (19 of 22)]
[im 1/22]
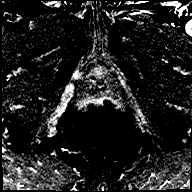

[Series 52: post t1_twist_tra_dyn-copy center · axial · 3.5mm · 0.83mm/px · 1 of 22 slices shown (21 of 24)]
[im 1/22]
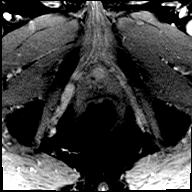

[Series 53: post t1_twist_tra_dyn-copy cent_sub_ttc=(id) · axial · 3.5mm · 0.83mm/px · 1 of 22 slices shown (20 of 22)]
[im 1/22]
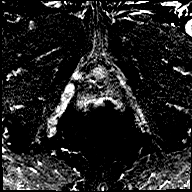

[Series 54: post t1_twist_tra_dyn-copy center · axial · 3.5mm · 0.83mm/px · 1 of 22 slices shown (22 of 24)]
[im 1/22]
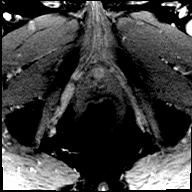

[Series 55: post t1_twist_tra_dyn-copy cent_sub_ttc=(id) · axial · 3.5mm · 0.83mm/px · 1 of 22 slices shown (21 of 22)]
[im 1/22]
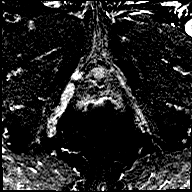

[Series 56: post t1_twist_tra_dyn-copy center · axial · 3.5mm · 0.83mm/px · 1 of 22 slices shown (23 of 24)]
[im 1/22]
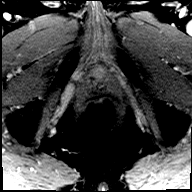

[Series 57: post t1_twist_tra_dyn-copy cent_sub_ttc=(id) · axial · 3.5mm · 0.83mm/px · 1 of 22 slices shown (22 of 22)]
[im 1/22]
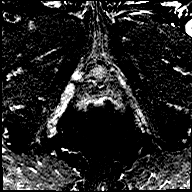

[Series 58: post t1_twist_tra_dyn-copy center · axial · 3.5mm · 0.83mm/px · 1 of 22 slices shown (24 of 24)]
[im 1/22]
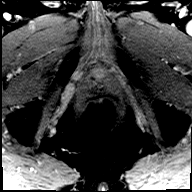

[56 of 56 positions shown; findings below may reference images not displayed]

FINDINGS: Prostate: A PI-RADS category 3 lesion of the posteromedial
peripheral zone in the left mid gland has a volume of 0.22 cubic cm
(1.2 by 0.6 by 0.5 cm). Designated region of interest # 1.

A PI-RADS category 3 lesion involving the right apical anterior
transition zone and anterior peripheral zone has low T2 signal and
likely low ADC map activity without accentuated diffusion weighted
signal and with only equivocal accentuated enhancement. Much of this
appears to be nodularity extending from the transition zone and
accordingly some degree of enhancement might be allow bowl without
upgrading to PI-RADS category 4. This lesion measures 0.41 cubic cm
(1.0 by 0.5 by 0.9 cm) and is designated as region of interest # 2.

Volume: 3D volumetric analysis: 6.2 by 5.3 by 6.6 cm (104.52 cubic
cm).

Transcapsular spread:  Absent

Seminal vesicle involvement: Absent

Neurovascular bundle involvement: Absent

Pelvic adenopathy: Absent

Bone metastasis: Absent

Other findings: Sigmoid diverticulosis.
IMPRESSION: 1. PI-RADS category 3 lesion in the posteromedial peripheral zone in
the left mid gland, and a separate PI-RADS category 3 lesion in the
right apical anterior transition zone and anterior peripheral zone
as detailed above. These are designated as regions of interest # 1
and # 2, respectively, for UroNAV targeting..
2. Considerable prostatomegaly, prostate volume approximately
cubic cm.
3. Sigmoid colon diverticulosis.

## 2018-12-17 NOTE — Progress Notes (Signed)
Cardiology Office Note   Date:  12/18/2018   ID:  Frederick Glover, DOB Mar 21, 1940, MRN [id number]  PCP:  Kirby Funk, MD    No chief complaint on file.  PAF  Wt Readings from Last 3 Encounters:  12/18/18 201 lb 1.9 oz (91.2 kg)  06/05/17 204 lb 3.2 oz (92.6 kg)  05/15/16 197 lb 12.8 oz (89.7 kg)       History of Present Illness: Frederick Glover is a 78 y.o. male  who has had PAF. He saw Dr. Johney Frame who recommended that he come off of coumadin several years ago. He had an AFib ablation in 2011.  [facility] screening in 2013. Mild RICA plaque. Increased Right ABI.   He has had borderline BP in the past.  He has tried to be more active and check his BP at home more regularly. Readings have been better since he lost weight through diet control. He stopped eating late at night. He tries to fast for at least 10 hours a day.   His grandson passed away in [place] in 12-27-22, while studying abroad.  He died of methanol poisoning.   Denies : Chest pain. Dizziness. Leg edema. Nitroglycerin use. Orthopnea. Palpitations. Paroxysmal nocturnal dyspnea. Shortness of breath. Syncope.   Trying to eat healthy.    He had a colonoscopy at [facility] with polypectomy.         Past Medical History:  Diagnosis Date  . Allergic rhinitis   . Atrial fibrillation (HCC)    atrial flutter PVI/CTI ablation 7/11  . BPH (benign prostatic hyperplasia)   . Chest pain   . DJD (degenerative joint disease)   . Hearing loss   . Herpes simplex    , left butt  . Plantar fasciitis   . Rheumatic fever    denies h/o   . Tinnitus     Past Surgical History:  Procedure Laterality Date  . afib/atrial flutter ablation  7/11   afib ablation by JA  . arthroscopic knee surgery    . HERNIA REPAIR    . LAMINECTOMY    . s/p appy    . VASECTOMY       Current Outpatient Medications  Medication Sig Dispense Refill  . acyclovir (ZOVIRAX) 400 MG tablet 2 tablets twice a day for 5 days as needed for  flare of fever blister    . aspirin 81 MG tablet Take 81 mg by mouth daily.    . fexofenadine (ALLEGRA) 180 MG tablet Take 180 mg by mouth daily. allergies    . fluoruracil (CARAC) 0.5 % cream Apply to skin twice daily for 2 weeks as needed for pre-cancerous skin lesions 30 g 0  . FLUZONE HIGH-DOSE 0.5 ML SUSY   0  . Glucosamine-Chondroit-Vit C-Mn (GLUCOSAMINE-CHONDROITIN) CAPS Take 2 capsules by mouth daily.     . mometasone (NASONEX) 50 MCG/ACT nasal spray Place 2 sprays into the nose as needed (allergies).     . Multiple Vitamins-Minerals (MULTIVITAL) tablet Take 1 tablet by mouth daily.       No current facility-administered medications for this visit.     Allergies:   Patient has no known allergies.    Social History:  The patient  reports that he has never smoked. He has never used smokeless tobacco. He reports current alcohol use. He reports that he does not use drugs.   Family History:  The patient's family history includes Atrial fibrillation in his father; Heart attack in his brother; Parkinsonism in  his father; Stroke in his mother.    ROS:  Please see the history of present illness.   Otherwise, review of systems are positive for .   All other systems are reviewed and negative.    PHYSICAL EXAM: VS:  BP (!) 150/98   Pulse 84   Ht 5\' 10"  (1.778 m)   Wt 201 lb 1.9 oz (91.2 kg)   SpO2 96%   BMI 28.86 kg/m  , BMI Body mass index is 28.86 kg/m. GEN: Well nourished, well developed, in no acute distress  HEENT: normal  Neck: no JVD, carotid bruits, or masses Cardiac: RRR; no murmurs, rubs, or gallops,no edema  Respiratory:  clear to auscultation bilaterally, normal work of breathing GI: soft, nontender, nondistended, + BS MS: no deformity or atrophy  Skin: warm and dry, no rash Neuro:  Strength and sensation are intact Psych: euthymic mood, full affect   EKG:   The ekg ordered today demonstrates NSR, no ST changes   Recent Labs: No results found for requested  labs within last 8760 hours.   Lipid Panel No results found for: CHOL, TRIG, HDL, CHOLHDL, VLDL, LDLCALC, LDLDIRECT   Other studies Reviewed: Additional studies/ records that were reviewed today with results demonstrating: no recent labs .   ASSESSMENT AND PLAN:  1. AFib: in NSR.  S/p ablations. 2. Elevated BP: Noted in the past.  Improved with increased activity. Repeat BP today 136/80.  Check BP at home.  3. CArotid disease: Mild in 2013.  No bruits.  COntinue healthy diet and exercise target below. 4. He will get his labs with Dr. Laurann Montana when fasting.   Current medicines are reviewed at length with the patient today.  The patient concerns regarding his medicines were addressed.  The following changes have been made:  No change  Labs/ tests ordered today include:  No orders of the defined types were placed in this encounter.   Recommend 150 minutes/week of aerobic exercise Low fat, low carb, high fiber diet recommended  Disposition:   FU in 1 year   Signed, Larae Grooms, MD  12/18/2018 10:46 AM    [facility] [place], [place], [place]  [address] Phone: [phone]; Fax: [phone]

## 2018-12-18 ENCOUNTER — Ambulatory Visit: Payer: Medicare Other | Admitting: Interventional Cardiology

## 2018-12-18 ENCOUNTER — Encounter: Payer: Self-pay | Admitting: Interventional Cardiology

## 2018-12-18 ENCOUNTER — Other Ambulatory Visit: Payer: Self-pay

## 2018-12-18 ENCOUNTER — Encounter

## 2018-12-18 VITALS — BP 150/98 | HR 84 | Ht 70.0 in | Wt 201.1 lb

## 2018-12-18 DIAGNOSIS — R030 Elevated blood-pressure reading, without diagnosis of hypertension: Secondary | ICD-10-CM

## 2018-12-18 DIAGNOSIS — I6521 Occlusion and stenosis of right carotid artery: Secondary | ICD-10-CM | POA: Diagnosis not present

## 2018-12-18 DIAGNOSIS — I480 Paroxysmal atrial fibrillation: Secondary | ICD-10-CM

## 2018-12-18 NOTE — Patient Instructions (Signed)

## 2019-04-01 ENCOUNTER — Ambulatory Visit: Payer: Medicare PPO | Attending: Internal Medicine

## 2019-04-01 DIAGNOSIS — Z23 Encounter for immunization: Secondary | ICD-10-CM | POA: Insufficient documentation

## 2019-04-01 NOTE — Progress Notes (Signed)
   Covid-19 Vaccination Clinic  Name:  Frederick Glover    MRN: [id number] DOB: Mar 17, 1940  04/01/2019  Mr. Frederick Glover was observed post Covid-19 immunization for 15 minutes without incidence. He was provided with Vaccine Information Sheet and instruction to access the V-Safe system.   Mr. Frederick Glover was instructed to call 911 with any severe reactions post vaccine: Marland Kitchen Difficulty breathing  . Swelling of your face and throat  . A fast heartbeat  . A bad rash all over your body  . Dizziness and weakness    Immunizations Administered    Name Date Dose VIS Date Route   Pfizer COVID-19 Vaccine 04/01/2019  3:40 PM 0.3 mL 02/21/2019 Intramuscular   Manufacturer: [employer], [employer]   Lot: [id number]   NDC: [id number]

## 2019-04-04 NOTE — Telephone Encounter (Signed)
Dr. Eldridge Dace spoke with patient regarding his friend Annitta [place] DOB: 10/16/1935. Patient's friend has had a DCCV in the past an is now having some faster heart rates. Patient is requesting that Dr. Eldridge Dace review his chart. I will contact the patient's friend for permission.

## 2019-04-08 NOTE — Telephone Encounter (Signed)
I have arranged Zio for Mr. Frederick Glover. Please see his chart.

## 2019-04-23 ENCOUNTER — Ambulatory Visit: Payer: Medicare PPO | Attending: Internal Medicine

## 2019-04-23 DIAGNOSIS — Z23 Encounter for immunization: Secondary | ICD-10-CM | POA: Insufficient documentation

## 2019-04-23 NOTE — Progress Notes (Signed)
   Covid-19 Vaccination Clinic  Name:  Frederick Glover    MRN: [id number] DOB: [birth year]  04/23/2019  Mr. [name] was observed post Covid-19 immunization for 15 minutes without incidence. He was provided with Vaccine Information Sheet and instruction to access the V-Safe system.   Mr. [name] was instructed to call 911 with any severe reactions post vaccine: Marland Kitchen Difficulty breathing  . Swelling of your face and throat  . A fast heartbeat  . A bad rash all over your body  . Dizziness and weakness    Immunizations Administered    Name Date Dose VIS Date Route   Pfizer COVID-19 Vaccine 04/23/2019  8:28 AM 0.3 mL 02/21/2019 Intramuscular   Manufacturer: [employer], [employer]   Lot: [id number]   NDC: [address]-4

## 2019-11-11 ENCOUNTER — Ambulatory Visit: Payer: Medicare PPO | Attending: Critical Care Medicine

## 2019-11-11 DIAGNOSIS — Z23 Encounter for immunization: Secondary | ICD-10-CM

## 2019-11-11 NOTE — Progress Notes (Signed)
   Covid-19 Vaccination Clinic  Name:  JORDIN VICENCIO    MRN: [id number] DOB: 04-Oct-1940  11/11/2019  Mr. Maines was observed post Covid-19 immunization for 15 minutes without incident. He was provided with Vaccine Information Sheet and instruction to access the V-Safe system.   Mr. Hankerson was instructed to call 911 with any severe reactions post vaccine: Marland Kitchen Difficulty breathing  . Swelling of face and throat  . A fast heartbeat  . A bad rash all over body  . Dizziness and weakness

## 2019-12-23 ENCOUNTER — Ambulatory Visit: Payer: Medicare PPO | Admitting: Interventional Cardiology

## 2019-12-23 NOTE — Progress Notes (Signed)
Cardiology Office Note   Date:  12/24/2019   ID:  Frederick Glover, DOB April 23, 1940, MRN [id number]  PCP:  Kirby Funk, MD    No chief complaint on file.  PAF  Wt Readings from Last 3 Encounters:  12/24/19 198 lb 12.8 oz (90.2 kg)  12/18/18 201 lb 1.9 oz (91.2 kg)  06/05/17 204 lb 3.2 oz (92.6 kg)       History of Present Illness: Frederick Glover is a 79 y.o. male  who has had PAF. He saw Dr. Johney Frame who recommended that he come off of coumadin several years ago. He had an AFib ablation in 2011.  [facility] screening in 2013. Mild RICA plaque. Increased Right ABI.   He has had borderline BP in the past.He has tried to be more active and check his BP at home more regularly. Readings have been better since he lost weight through diet control. He stopped eating late at night. He tries to fast for at least 10 hours a day.  His grandson passed away in [place] in 24-Dec-2022, while studying abroad. He died of methanol poisoning.   He had a colonoscopy at [facility] with polypectomy. Everything turned out well.   He had his COVID shots x 3.  Denies : Chest pain. Dizziness. Leg edema. Nitroglycerin use. Orthopnea. Palpitations. Paroxysmal nocturnal dyspnea. Shortness of breath. Syncope.   Wife broke her hip while she was in [place].  Walking limited by knee and hip pain.    Past Medical History:  Diagnosis Date   Allergic rhinitis    Atrial fibrillation (HCC)    atrial flutter PVI/CTI ablation 7/11   BPH (benign prostatic hyperplasia)    Chest pain    DJD (degenerative joint disease)    Hearing loss    Herpes simplex    , left butt   Plantar fasciitis    Rheumatic fever    denies h/o    Tinnitus     Past Surgical History:  Procedure Laterality Date   afib/atrial flutter ablation  7/11   afib ablation by [facility]   arthroscopic knee surgery     HERNIA REPAIR     LAMINECTOMY     s/p appy     VASECTOMY       Current Outpatient Medications   Medication Sig Dispense Refill   acyclovir (ZOVIRAX) 400 MG tablet 2 tablets twice a day for 5 days as needed for flare of fever blister     aspirin 81 MG tablet Take 81 mg by mouth daily.     fexofenadine (ALLEGRA) 180 MG tablet Take 180 mg by mouth daily. allergies     fluoruracil (CARAC) 0.5 % cream Apply to skin twice daily for 2 weeks as needed for pre-cancerous skin lesions 30 g 0   FLUZONE HIGH-DOSE 0.5 ML SUSY   0   Glucosamine-Chondroit-Vit C-Mn (GLUCOSAMINE-CHONDROITIN) CAPS Take 2 capsules by mouth daily.      mometasone (NASONEX) 50 MCG/ACT nasal spray Place 2 sprays into the nose as needed (allergies).      Multiple Vitamins-Minerals (MULTIVITAL) tablet Take 1 tablet by mouth daily.       No current facility-administered medications for this visit.    Allergies:   Patient has no known allergies.    Social History:  The patient  reports that he has never smoked. He has never used smokeless tobacco. He reports current alcohol use. He reports that he does not use drugs.   Family History:  The patient's family  history includes Atrial fibrillation in his father; Heart attack in his brother; Parkinsonism in his father; Stroke in his mother.    ROS:  Please see the history of present illness.   Otherwise, review of systems are positive for hip pain.   All other systems are reviewed and negative.    PHYSICAL EXAM: VS:  BP (!) 142/88    Pulse 85    Ht 5\' 10"  (1.778 m)    Wt 198 lb 12.8 oz (90.2 kg)    SpO2 93%    BMI 28.52 kg/m  , BMI Body mass index is 28.52 kg/m. GEN: Well nourished, well developed, in no acute distress  HEENT: normal  Neck: no JVD, carotid bruits, or masses Cardiac: RRR; no murmurs, rubs, or gallops,no edema  Respiratory:  clear to auscultation bilaterally, normal work of breathing GI: soft, nontender, nondistended, + BS MS: no deformity or atrophy  Skin: warm and dry, no rash Neuro:  Strength and sensation are intact Psych: euthymic mood, full  affect   EKG:   The ekg ordered today demonstrates NSR,, no ST segment   Recent Labs: No results found for requested labs within last 8760 hours.   Lipid Panel No results found for: CHOL, TRIG, HDL, CHOLHDL, VLDL, LDLCALC, LDLDIRECT   Other studies Reviewed: Additional studies/ records that were reviewed today with results demonstrating: labs reviewed.   ASSESSMENT AND PLAN:  1. AFib: s/p AFib ablation in 2011.  Has been off of anticoagulation for several years.  Doing well without palpitations.  2. Carotid disease: Mild, noted on prior lifeline screen.  No bruit.  No new sx. 3. Elevated BP:  Follows BP at home.  Usually in the 130s/70s range.  No need for nay meds at this time.  4. Will see Dr. 2012 in Jan 2022 and have labs done at that time.    Current medicines are reviewed at length with the patient today.  The patient concerns regarding his medicines were addressed.  The following changes have been made:  No change  Labs/ tests ordered today include:  No orders of the defined types were placed in this encounter.   Recommend 150 minutes/week of aerobic exercise Low fat, low carb, high fiber diet recommended  Disposition:   FU in 1 year   Signed, Feb 2022, MD  12/24/2019 2:39 PM    [facility] [address] [place], [place], [place]  [place] Phone: [phone]; Fax: [phone]

## 2019-12-24 ENCOUNTER — Encounter: Payer: Self-pay | Admitting: Interventional Cardiology

## 2019-12-24 ENCOUNTER — Ambulatory Visit: Payer: Medicare PPO | Admitting: Interventional Cardiology

## 2019-12-24 ENCOUNTER — Other Ambulatory Visit: Payer: Self-pay

## 2019-12-24 VITALS — BP 142/88 | HR 85 | Ht 70.0 in | Wt 198.8 lb

## 2019-12-24 DIAGNOSIS — R030 Elevated blood-pressure reading, without diagnosis of hypertension: Secondary | ICD-10-CM | POA: Diagnosis not present

## 2019-12-24 DIAGNOSIS — I6521 Occlusion and stenosis of right carotid artery: Secondary | ICD-10-CM | POA: Diagnosis not present

## 2019-12-24 DIAGNOSIS — I480 Paroxysmal atrial fibrillation: Secondary | ICD-10-CM | POA: Diagnosis not present

## 2019-12-24 NOTE — Patient Instructions (Signed)
Medication Instructions:  Your physician recommends that you continue on your current medications as directed. Please refer to the Current Medication list given to you today.  *If you need a refill on your cardiac medications before your next appointment, please call your pharmacy*   Lab Work: None  If you have labs (blood work) drawn today and your tests are completely normal, you will receive your results only by: . MyChart Message (if you have MyChart) OR . A paper copy in the mail If you have any lab test that is abnormal or we need to change your treatment, we will call you to review the results.   Testing/Procedures: None   Follow-Up: At [facility], you and your health needs are our priority.  As part of our continuing mission to provide you with exceptional heart care, we have created designated Provider Care Teams.  These Care Teams include your primary Cardiologist (physician) and Advanced Practice Providers (APPs -  Physician Assistants and Nurse Practitioners) who all work together to provide you with the care you need, when you need it.  We recommend signing up for the patient portal called "MyChart".  Sign up information is provided on this After Visit Summary.  MyChart is used to connect with patients for Virtual Visits (Telemedicine).  Patients are able to view lab/test results, encounter notes, upcoming appointments, etc.  Non-urgent messages can be sent to your provider as well.   To learn more about what you can do with MyChart, go to [email or web address].    Your next appointment:   12 month(s)  The format for your next appointment:   In Person  Provider:   You may see Jay Varanasi, MD or one of the following Advanced Practice Providers on your designated Care Team:    Dayna Dunn, PA-C  Michele Lenze, PA-C    Other Instructions None  

## 2020-01-09 DIAGNOSIS — M545 Low back pain, unspecified: Secondary | ICD-10-CM | POA: Diagnosis not present

## 2020-01-09 DIAGNOSIS — M25551 Pain in right hip: Secondary | ICD-10-CM | POA: Diagnosis not present

## 2020-01-09 DIAGNOSIS — M1711 Unilateral primary osteoarthritis, right knee: Secondary | ICD-10-CM | POA: Diagnosis not present

## 2020-03-03 DIAGNOSIS — L570 Actinic keratosis: Secondary | ICD-10-CM | POA: Diagnosis not present

## 2020-03-03 DIAGNOSIS — L821 Other seborrheic keratosis: Secondary | ICD-10-CM | POA: Diagnosis not present

## 2020-03-03 DIAGNOSIS — Z85828 Personal history of other malignant neoplasm of skin: Secondary | ICD-10-CM | POA: Diagnosis not present

## 2020-03-03 DIAGNOSIS — D485 Neoplasm of uncertain behavior of skin: Secondary | ICD-10-CM | POA: Diagnosis not present

## 2020-06-01 DIAGNOSIS — H9193 Unspecified hearing loss, bilateral: Secondary | ICD-10-CM | POA: Diagnosis not present

## 2020-06-03 DIAGNOSIS — I480 Paroxysmal atrial fibrillation: Secondary | ICD-10-CM | POA: Diagnosis not present

## 2020-06-03 DIAGNOSIS — N400 Enlarged prostate without lower urinary tract symptoms: Secondary | ICD-10-CM | POA: Diagnosis not present

## 2020-06-03 DIAGNOSIS — Z0000 Encounter for general adult medical examination without abnormal findings: Secondary | ICD-10-CM | POA: Diagnosis not present

## 2020-06-03 DIAGNOSIS — Z136 Encounter for screening for cardiovascular disorders: Secondary | ICD-10-CM | POA: Diagnosis not present

## 2020-06-03 DIAGNOSIS — D126 Benign neoplasm of colon, unspecified: Secondary | ICD-10-CM | POA: Diagnosis not present

## 2020-06-03 DIAGNOSIS — M179 Osteoarthritis of knee, unspecified: Secondary | ICD-10-CM | POA: Diagnosis not present

## 2020-06-03 DIAGNOSIS — Z1389 Encounter for screening for other disorder: Secondary | ICD-10-CM | POA: Diagnosis not present

## 2020-06-23 DIAGNOSIS — Z961 Presence of intraocular lens: Secondary | ICD-10-CM | POA: Diagnosis not present

## 2020-09-14 DIAGNOSIS — B351 Tinea unguium: Secondary | ICD-10-CM | POA: Diagnosis not present

## 2020-09-14 DIAGNOSIS — L821 Other seborrheic keratosis: Secondary | ICD-10-CM | POA: Diagnosis not present

## 2020-09-14 DIAGNOSIS — Z85828 Personal history of other malignant neoplasm of skin: Secondary | ICD-10-CM | POA: Diagnosis not present

## 2020-11-17 DIAGNOSIS — L309 Dermatitis, unspecified: Secondary | ICD-10-CM | POA: Diagnosis not present

## 2020-11-17 DIAGNOSIS — Z7982 Long term (current) use of aspirin: Secondary | ICD-10-CM | POA: Diagnosis not present

## 2020-11-17 DIAGNOSIS — Z818 Family history of other mental and behavioral disorders: Secondary | ICD-10-CM | POA: Diagnosis not present

## 2020-11-17 DIAGNOSIS — R32 Unspecified urinary incontinence: Secondary | ICD-10-CM | POA: Diagnosis not present

## 2020-11-17 DIAGNOSIS — Z823 Family history of stroke: Secondary | ICD-10-CM | POA: Diagnosis not present

## 2020-11-17 DIAGNOSIS — Z85828 Personal history of other malignant neoplasm of skin: Secondary | ICD-10-CM | POA: Diagnosis not present

## 2020-11-17 DIAGNOSIS — R030 Elevated blood-pressure reading, without diagnosis of hypertension: Secondary | ICD-10-CM | POA: Diagnosis not present

## 2020-11-17 DIAGNOSIS — Z8249 Family history of ischemic heart disease and other diseases of the circulatory system: Secondary | ICD-10-CM | POA: Diagnosis not present

## 2020-12-15 DIAGNOSIS — M25551 Pain in right hip: Secondary | ICD-10-CM | POA: Diagnosis not present

## 2020-12-15 DIAGNOSIS — M545 Low back pain, unspecified: Secondary | ICD-10-CM | POA: Diagnosis not present

## 2020-12-16 DIAGNOSIS — M545 Low back pain, unspecified: Secondary | ICD-10-CM | POA: Diagnosis not present

## 2020-12-31 DIAGNOSIS — M6281 Muscle weakness (generalized): Secondary | ICD-10-CM | POA: Diagnosis not present

## 2020-12-31 DIAGNOSIS — M5106 Intervertebral disc disorders with myelopathy, lumbar region: Secondary | ICD-10-CM | POA: Diagnosis not present

## 2021-01-07 DIAGNOSIS — M5106 Intervertebral disc disorders with myelopathy, lumbar region: Secondary | ICD-10-CM | POA: Diagnosis not present

## 2021-01-07 DIAGNOSIS — M6281 Muscle weakness (generalized): Secondary | ICD-10-CM | POA: Diagnosis not present

## 2021-01-19 NOTE — Progress Notes (Signed)
Cardiology Office Note   Date:  01/20/2021   ID:  Frederick Glover, DOB 12/05/1940, MRN [id number]  PCP:  Kirby Funk, MD    No chief complaint on file.  AFib  Wt Readings from Last 3 Encounters:  01/20/21 199 lb 9.6 oz (90.5 kg)  12/24/19 198 lb 12.8 oz (90.2 kg)  12/18/18 201 lb 1.9 oz (91.2 kg)       History of Present Illness: Frederick Glover is a 80 y.o. male  who has had PAF. He saw Dr. Johney Frame who recommended that he come off of coumadin several years ago.  He had an AFib ablation in 2011.   [facility] screening in 2013.  Mild RICA plaque.  Increased Right ABI.     He has had borderline BP in the past.  He has tried to be more active and check his BP at home more regularly.  Readings have been better since he lost weight through diet control.  He stopped eating late at night.  He tries to fast for at least 10 hours a day.     His grandson passed away in [place] in 12-09-22, while studying abroad.  He died of methanol poisoning.    He had a colonoscopy at [facility] with polypectomy. Everything turned out well.    He had his COVID shots.  Walking limited by knee and hip pain. Started PT. Sees Dr. Dion Saucier.  Denies :  Exertional chest pain, Dizziness. Leg edema. Nitroglycerin use. Orthopnea. Palpitations. Paroxysmal nocturnal dyspnea. Shortness of breath. Syncope.        Past Medical History:  Diagnosis Date   Allergic rhinitis    Atrial fibrillation (HCC)    atrial flutter PVI/CTI ablation 7/11   BPH (benign prostatic hyperplasia)    Chest pain    DJD (degenerative joint disease)    Hearing loss    Herpes simplex    , left butt   Plantar fasciitis    Rheumatic fever    denies h/o    Tinnitus     Past Surgical History:  Procedure Laterality Date   afib/atrial flutter ablation  7/11   afib ablation by JA   arthroscopic knee surgery     HERNIA REPAIR     LAMINECTOMY     s/p appy     VASECTOMY       Current Outpatient Medications  Medication Sig Dispense  Refill   acyclovir (ZOVIRAX) 400 MG tablet 2 tablets twice a day for 5 days as needed for flare of fever blister     aspirin 81 MG tablet Take 81 mg by mouth daily.     fexofenadine (ALLEGRA) 180 MG tablet Take 180 mg by mouth daily. allergies     fluoruracil (CARAC) 0.5 % cream Apply to skin twice daily for 2 weeks as needed for pre-cancerous skin lesions 30 g 0   FLUZONE HIGH-DOSE 0.5 ML SUSY   0   Glucosamine-Chondroit-Vit C-Mn (GLUCOSAMINE-CHONDROITIN) CAPS Take 2 capsules by mouth daily.      mometasone (NASONEX) 50 MCG/ACT nasal spray Place 2 sprays into the nose as needed (allergies).      Multiple Vitamins-Minerals (MULTIVITAL) tablet Take 1 tablet by mouth daily.       No current facility-administered medications for this visit.    Allergies:   Patient has no known allergies.    Social History:  The patient  reports that he has never smoked. He has never used smokeless tobacco. He reports current alcohol use. He  reports that he does not use drugs.   Family History:  The patient's family history includes Atrial fibrillation in his father; Heart attack in his brother; Parkinsonism in his father; Stroke in his mother.    ROS:  Please see the history of present illness.   Otherwise, review of systems are positive for hip and knee pain.   All other systems are reviewed and negative.    PHYSICAL EXAM: VS:  BP (!) 144/86   Pulse 61   Ht 5\' 10"  (1.778 m)   Wt 199 lb 9.6 oz (90.5 kg)   SpO2 96%   BMI 28.64 kg/m  , BMI Body mass index is 28.64 kg/m. GEN: Well nourished, well developed, in no acute distress HEENT: normal Neck: no JVD, carotid bruits, or masses Cardiac: RRR; no murmurs, rubs, or gallops,no edema  Respiratory:  clear to auscultation bilaterally, normal work of breathing GI: soft, nontender, nondistended, + BS MS: no deformity or atrophy Skin: warm and dry, no rash Neuro:  Strength and sensation are intact Psych: euthymic mood, full affect   EKG:   The ekg  ordered today demonstrates NSR, no ST changes   Recent Labs: No results found for requested labs within last 8760 hours.   Lipid Panel No results found for: CHOL, TRIG, HDL, CHOLHDL, VLDL, LDLCALC, LDLDIRECT   Other studies Reviewed: Additional studies/ records that were reviewed today with results demonstrating: labs reviewed; LDL 97.   ASSESSMENT AND PLAN:  AFib: s/p AFib ablation in 2011. He has been off of anticoagulation. No palpitations.  Atypical chest pain.  Worse with palpation of the left pectoral area.  He has been doing some upper body exercises.  He will monitor his symptoms.  If they become more exertional, would plan for some type of ischemic evaluation.  Currently the symptoms only last a few seconds and go away on their own. Carotid disease: Mild by prior u/s. Elevated BP: BPs 130s systolic at home.  He does try to walk regularly.  He eats healthy as well.  He avoids processed foods.   Current medicines are reviewed at length with the patient today.  The patient concerns regarding his medicines were addressed.  The following changes have been made:  No change  Labs/ tests ordered today include:  No orders of the defined types were placed in this encounter.   Recommend 150 minutes/week of aerobic exercise Low fat, low carb, high fiber diet recommended  Disposition:   FU in 3 months to reassess CP   Signed, 2012, MD  01/20/2021 4:04 PM    [facility] [address] [place], [place], [place]  [place] Phone: [phone]; Fax: [phone]

## 2021-01-20 ENCOUNTER — Encounter: Payer: Self-pay | Admitting: Interventional Cardiology

## 2021-01-20 ENCOUNTER — Ambulatory Visit: Payer: Medicare PPO | Admitting: Interventional Cardiology

## 2021-01-20 ENCOUNTER — Other Ambulatory Visit: Payer: Self-pay

## 2021-01-20 VITALS — BP 144/86 | HR 61 | Ht 70.0 in | Wt 199.6 lb

## 2021-01-20 DIAGNOSIS — R030 Elevated blood-pressure reading, without diagnosis of hypertension: Secondary | ICD-10-CM

## 2021-01-20 DIAGNOSIS — R0789 Other chest pain: Secondary | ICD-10-CM

## 2021-01-20 DIAGNOSIS — I6521 Occlusion and stenosis of right carotid artery: Secondary | ICD-10-CM

## 2021-01-20 DIAGNOSIS — I480 Paroxysmal atrial fibrillation: Secondary | ICD-10-CM | POA: Diagnosis not present

## 2021-01-20 NOTE — Patient Instructions (Signed)
Medication Instructions:  Your physician recommends that you continue on your current medications as directed. Please refer to the Current Medication list given to you today.  *If you need a refill on your cardiac medications before your next appointment, please call your pharmacy*   Lab Work: none If you have labs (blood work) drawn today and your tests are completely normal, you will receive your results only by: MyChart Message (if you have MyChart) OR A paper copy in the mail If you have any lab test that is abnormal or we need to change your treatment, we will call you to review the results.   Testing/Procedures: none   Follow-Up: At [facility], you and your health needs are our priority.  As part of our continuing mission to provide you with exceptional heart care, we have created designated Provider Care Teams.  These Care Teams include your primary Cardiologist (physician) and Advanced Practice Providers (APPs -  Physician Assistants and Nurse Practitioners) who all work together to provide you with the care you need, when you need it.  We recommend signing up for the patient portal called "MyChart".  Sign up information is provided on this After Visit Summary.  MyChart is used to connect with patients for Virtual Visits (Telemedicine).  Patients are able to view lab/test results, encounter notes, upcoming appointments, etc.  Non-urgent messages can be sent to your provider as well.   To learn more about what you can do with MyChart, go to [email or web address].    Your next appointment:   January 31,2023 at 10:00  The format for your next appointment:   In Person  Provider:   Lance Muss, MD     Other Instructions

## 2021-01-21 DIAGNOSIS — M5106 Intervertebral disc disorders with myelopathy, lumbar region: Secondary | ICD-10-CM | POA: Diagnosis not present

## 2021-01-21 DIAGNOSIS — M6281 Muscle weakness (generalized): Secondary | ICD-10-CM | POA: Diagnosis not present

## 2021-03-18 DIAGNOSIS — M6281 Muscle weakness (generalized): Secondary | ICD-10-CM | POA: Diagnosis not present

## 2021-03-18 DIAGNOSIS — M5106 Intervertebral disc disorders with myelopathy, lumbar region: Secondary | ICD-10-CM | POA: Diagnosis not present

## 2021-04-12 ENCOUNTER — Ambulatory Visit: Payer: Medicare PPO | Admitting: Interventional Cardiology

## 2021-05-17 DIAGNOSIS — L821 Other seborrheic keratosis: Secondary | ICD-10-CM | POA: Diagnosis not present

## 2021-05-17 DIAGNOSIS — D1801 Hemangioma of skin and subcutaneous tissue: Secondary | ICD-10-CM | POA: Diagnosis not present

## 2021-05-17 DIAGNOSIS — L570 Actinic keratosis: Secondary | ICD-10-CM | POA: Diagnosis not present

## 2021-05-17 DIAGNOSIS — B009 Herpesviral infection, unspecified: Secondary | ICD-10-CM | POA: Diagnosis not present

## 2021-05-17 DIAGNOSIS — Z85828 Personal history of other malignant neoplasm of skin: Secondary | ICD-10-CM | POA: Diagnosis not present

## 2021-05-17 DIAGNOSIS — D485 Neoplasm of uncertain behavior of skin: Secondary | ICD-10-CM | POA: Diagnosis not present

## 2021-05-17 DIAGNOSIS — L814 Other melanin hyperpigmentation: Secondary | ICD-10-CM | POA: Diagnosis not present

## 2021-05-17 DIAGNOSIS — B351 Tinea unguium: Secondary | ICD-10-CM | POA: Diagnosis not present

## 2021-06-09 DIAGNOSIS — R5383 Other fatigue: Secondary | ICD-10-CM | POA: Diagnosis not present

## 2021-06-09 DIAGNOSIS — Z0000 Encounter for general adult medical examination without abnormal findings: Secondary | ICD-10-CM | POA: Diagnosis not present

## 2021-06-09 DIAGNOSIS — Z1389 Encounter for screening for other disorder: Secondary | ICD-10-CM | POA: Diagnosis not present

## 2021-06-09 DIAGNOSIS — D126 Benign neoplasm of colon, unspecified: Secondary | ICD-10-CM | POA: Diagnosis not present

## 2021-06-09 DIAGNOSIS — I480 Paroxysmal atrial fibrillation: Secondary | ICD-10-CM | POA: Diagnosis not present

## 2021-06-09 DIAGNOSIS — N400 Enlarged prostate without lower urinary tract symptoms: Secondary | ICD-10-CM | POA: Diagnosis not present

## 2021-06-28 DIAGNOSIS — Z961 Presence of intraocular lens: Secondary | ICD-10-CM | POA: Diagnosis not present

## 2021-06-30 ENCOUNTER — Encounter: Payer: Self-pay | Admitting: Interventional Cardiology

## 2021-07-06 ENCOUNTER — Ambulatory Visit: Payer: Medicare PPO | Admitting: Interventional Cardiology

## 2021-09-21 DIAGNOSIS — Z01110 Encounter for hearing examination following failed hearing screening: Secondary | ICD-10-CM | POA: Diagnosis not present

## 2021-09-27 ENCOUNTER — Ambulatory Visit: Payer: Medicare PPO | Admitting: Podiatry

## 2021-09-27 DIAGNOSIS — B351 Tinea unguium: Secondary | ICD-10-CM | POA: Diagnosis not present

## 2021-09-27 MED ORDER — EFINACONAZOLE 10 % EX SOLN: 1.0000 [drp] | Freq: Every day | CUTANEOUS | 11 refills | Status: DC

## 2021-09-27 NOTE — Patient Instructions (Signed)
Efinaconazole Topical Solution What is this medication? EFINACONAZOLE (e FEE na KON a zole) is an antifungal medicine. It is used to treat certain kinds of fungal infections of the toenail. This medicine may be used for other purposes; ask your health care provider or pharmacist if you have questions. COMMON BRAND NAME(S): JUBLIA What should I tell my care team before I take this medication? They need to know if you have any of these conditions: an unusual or allergic reaction to efinaconazole, other medicines, foods, dyes or preservatives pregnant or trying to get pregnant breast-feeding How should I use this medication? This medicine is for external use only. Do not take by mouth. Follow the directions on the label. Wash hands before and after use. Apply this medicine using the provided brush to cover the entire toenail. Do not use your medicine more often than directed. Finish the full course prescribed by your doctor or health care professional even if you think your condition is better. Do not stop using except on the advice of your doctor or health care professional. Talk to your pediatrician regarding the use of this medicine in children. While this drug may be prescribed for children as young as 6 years for selected conditions, precautions do apply. Overdosage: If you think you have taken too much of this medicine contact a poison control center or emergency room at once. NOTE: This medicine is only for you. Do not share this medicine with others. What if I miss a dose? If you miss a dose, use it as soon as you can. If it is almost time for your next dose, use only that dose. Do not use double or extra doses. What may interact with this medication? Interactions have not been studied. Do not use any other nail products (i.e., nail polish, pedicures) during treatment with this medicine. This list may not describe all possible interactions. Give your health care provider a list of all the  medicines, herbs, non-prescription drugs, or dietary supplements you use. Also tell them if you smoke, drink alcohol, or use illegal drugs. Some items may interact with your medicine. What should I watch for while using this medication? Do not get this medicine in your eyes. If you do, rinse out with plenty of cool tap water. Tell your doctor or health care professional if your symptoms do not start to get better or if they get worse. Wait for at least 10 minutes after bathing before applying this medication. After bathing, make sure that your feet are very dry. Fungal infections like moist conditions. Do not walk around barefoot. To help prevent reinfection, wear freshly washed cotton, not synthetic clothing. Tell your doctor or health care professional if you develop sores or blisters that do not heal properly. If your nail infection returns after you stop using this medicine, contact your doctor or health care professional. What side effects may I notice from receiving this medication? Side effects that you should report to your doctor or health care professional as soon as possible: allergic reactions like skin rash, itching or hives, swelling of the face, lips, or tongue ingrown toenail Side effects that usually do not require medical attention (report to your doctor or health care professional if they continue or are bothersome): mild skin irritation, burning, or itching This list may not describe all possible side effects. Call your doctor for medical advice about side effects. You may report side effects to FDA at 1-800-FDA-1088. Where should I keep my medication? Keep out of the   reach of children. Store at room temperature between 20 and 25 degrees C (68 and 77 degrees F). Keep this medicine in the original container. Throw away any unused medicine after the expiration date. This medicine is flammable. Avoid exposure to heat, fire, flame, and smoking. NOTE: This sheet is a summary. It may  not cover all possible information. If you have questions about this medicine, talk to your doctor, pharmacist, or health care provider.  2023 Elsevier/Gold Standard (2018-07-10 00:00:00)  

## 2021-09-27 NOTE — Progress Notes (Signed)
Subjective:   Patient ID: Frederick Glover, male   DOB: 81 y.o.   MRN: [id number]   HPI  Chief Complaint  Patient presents with   Nail Problem    Rm 11 Bilateral great nail discoloration and thickness x 2+ years. Pt has tried antifungal topical solutions for 1 years that helps sometimes.    Patient presents today with the above complaints. He has had nail fungus for many years on the big toenail. He has tried ciclopirox with minimal success. He is interested in laser and would like to start this. He has no pain to the nails. No drainage from the nails.    Review of Systems  All other systems reviewed and are negative.  Past Medical History:  Diagnosis Date   Allergic rhinitis    Atrial fibrillation (HCC)    atrial flutter PVI/CTI ablation 7/11   BPH (benign prostatic hyperplasia)    Chest pain    DJD (degenerative joint disease)    Hearing loss    Herpes simplex    , left butt   Plantar fasciitis    Rheumatic fever    denies h/o    Tinnitus     Past Surgical History:  Procedure Laterality Date   afib/atrial flutter ablation  7/11   afib ablation by JA   arthroscopic knee surgery     HERNIA REPAIR     LAMINECTOMY     s/p appy     VASECTOMY       Current Outpatient Medications:    Efinaconazole 10 % SOLN, Apply 1 drop topically daily., Disp: 4 mL, Rfl: 11   acyclovir (ZOVIRAX) 400 MG tablet, 2 tablets twice a day for 5 days as needed for flare of fever blister, Disp: , Rfl:    aspirin 81 MG tablet, Take 81 mg by mouth daily., Disp: , Rfl:    fexofenadine (ALLEGRA) 180 MG tablet, Take 180 mg by mouth daily. allergies, Disp: , Rfl:    fluoruracil (CARAC) 0.5 % cream, Apply to skin twice daily for 2 weeks as needed for pre-cancerous skin lesions, Disp: 30 g, Rfl: 0   FLUZONE HIGH-DOSE 0.5 ML SUSY, , Disp: , Rfl: 0   Glucosamine-Chondroit-Vit C-Mn (GLUCOSAMINE-CHONDROITIN) CAPS, Take 2 capsules by mouth daily. , Disp: , Rfl:    mometasone (NASONEX) 50 MCG/ACT nasal  spray, Place 2 sprays into the nose as needed (allergies). , Disp: , Rfl:    Multiple Vitamins-Minerals (MULTIVITAL) tablet, Take 1 tablet by mouth daily.  , Disp: , Rfl:   No Known Allergies         Objective:  Physical Exam  General: AAO x3, NAD  Dermatological: Bilateral hallux nails are hypertrophic, dystrophic with yellow discoloration.  There is no edema, erythema to the toenail sites.  No open lesions.     Vascular: Dorsalis Pedis artery and Posterior Tibial artery pedal pulses are 2/4 bilateral with immedate capillary fill time.  There is no pain with calf compression, swelling, warmth, erythema.   Neruologic: Grossly intact via light touch bilateral.   Musculoskeletal: No gross boney pedal deformities bilateral. No pain, crepitus, or limitation noted with foot and ankle range of motion bilateral. Muscular strength 5/5 in all groups tested bilateral.  Gait: Unassisted, Nonantalgic.       Assessment:   Onychomycosis     Plan:  -Treatment options discussed including all alternatives, risks, and complications -Etiology of symptoms were discussed -Discussed some treatment option including oral, topical as well as alternative treatments.  He wants to proceed with laser therapy.  He had the first laser treatment performed today.  Following standard precautions and wearing eye protection the bilateral hallux nails were lasered without any complications.  Tolerated well. -We discussed accessories of laser therapy not to also do topical as well.  He wants to hold off on oral medication.  Prescribe Jublia.  Vivi Barrack DPM

## 2021-09-27 NOTE — Progress Notes (Signed)
Bilateral great nail laser treatment. Nails filed thin. Nails most effected was nails 1 bilateral. Pt will come back for Nurse visit in 4 weeks. Pt tolerated treatment well.

## 2021-09-30 DIAGNOSIS — B351 Tinea unguium: Secondary | ICD-10-CM | POA: Insufficient documentation

## 2021-10-04 ENCOUNTER — Other Ambulatory Visit: Payer: Self-pay | Admitting: Podiatry

## 2021-10-04 MED ORDER — TAVABOROLE 5 % EX SOLN: 1.0000 [drp] | Freq: Every day | CUTANEOUS | 2 refills | Status: DC

## 2021-10-04 NOTE — Progress Notes (Signed)
Sent kerydin as Hermine Messick not covered

## 2021-10-24 ENCOUNTER — Ambulatory Visit (INDEPENDENT_AMBULATORY_CARE_PROVIDER_SITE_OTHER): Payer: Medicare PPO | Admitting: Podiatry

## 2021-10-24 DIAGNOSIS — B351 Tinea unguium: Secondary | ICD-10-CM

## 2021-10-24 NOTE — Progress Notes (Signed)
Patient presents today for the 2nd laser treatment. Diagnosed with onychomycosis by Dr. Ardelle Anton.    Toenail most affected bilateral hallux.    All other systems are negative.   Nails were filed thin. Laser therapy was administered to bilateral hallux and patient tolerated the treatment well. All safety precautions were in place.      Follow up in 4 weeks for laser # 3.

## 2021-11-28 ENCOUNTER — Ambulatory Visit (INDEPENDENT_AMBULATORY_CARE_PROVIDER_SITE_OTHER): Payer: Medicare PPO

## 2021-11-28 DIAGNOSIS — B351 Tinea unguium: Secondary | ICD-10-CM

## 2021-11-28 NOTE — Progress Notes (Signed)
Patient presents today for the 3rd laser treatment. Diagnosed with onychomycosis by Dr. Jacqualyn Posey.    Toenail most affected bilateral hallux.    All other systems are negative.   Nails were filed thin. Laser therapy was administered to bilateral hallux and patient tolerated the treatment well. All safety precautions were in place.      Follow up in 6 weeks for laser # 4.

## 2022-01-13 ENCOUNTER — Ambulatory Visit (INDEPENDENT_AMBULATORY_CARE_PROVIDER_SITE_OTHER): Payer: Medicare PPO

## 2022-01-13 DIAGNOSIS — B351 Tinea unguium: Secondary | ICD-10-CM

## 2022-01-13 NOTE — Progress Notes (Signed)
Patient presents today for the 4th laser treatment. Diagnosed with onychomycosis by Dr. Jacqualyn Posey.    Toenail most affected bilateral hallux.    All other systems are negative.   Nails were filed thin. Laser therapy was administered to bilateral hallux and patient tolerated the treatment well. All safety precautions were in place.      Follow up in 6 weeks for laser # 5

## 2022-02-24 ENCOUNTER — Other Ambulatory Visit: Payer: Medicare PPO

## 2022-03-09 ENCOUNTER — Telehealth: Payer: Self-pay | Admitting: Podiatry

## 2022-03-09 NOTE — Telephone Encounter (Signed)
Pt called asking if you can call him he has a question for you about his foot.  Please advise

## 2022-03-10 ENCOUNTER — Other Ambulatory Visit: Payer: Self-pay | Admitting: Podiatry

## 2022-03-14 ENCOUNTER — Other Ambulatory Visit: Payer: Self-pay | Admitting: Podiatry

## 2022-03-14 ENCOUNTER — Telehealth: Payer: Self-pay | Admitting: Podiatry

## 2022-03-14 MED ORDER — TAVABOROLE 5 % EX SOLN: 1.0000 [drp] | Freq: Every day | CUTANEOUS | 2 refills | Status: DC

## 2022-03-14 NOTE — Telephone Encounter (Signed)
Medication refill request  [facility]) [address] [place], [place]  [place], [place] [address]   Also is asking about the laser machine, if its back up and running.   Please advise.

## 2022-03-14 NOTE — Telephone Encounter (Signed)
Laser is up and running!

## 2022-03-21 ENCOUNTER — Ambulatory Visit (INDEPENDENT_AMBULATORY_CARE_PROVIDER_SITE_OTHER): Payer: Medicare PPO

## 2022-03-21 DIAGNOSIS — B351 Tinea unguium: Secondary | ICD-10-CM

## 2022-03-21 NOTE — Progress Notes (Signed)
Patient presents today for the 5th laser treatment. Diagnosed with onychomycosis by Dr. Jacqualyn Posey.    Toenail most affected bilateral hallux.    All other systems are negative.   Nails were filed thin. Laser therapy was administered to bilateral hallux and patient tolerated the treatment well. All safety precautions were in place.      Follow up in 6 weeks for laser # 6

## 2022-04-27 ENCOUNTER — Encounter: Payer: Self-pay | Admitting: Interventional Cardiology

## 2022-04-30 ENCOUNTER — Encounter: Payer: Self-pay | Admitting: Interventional Cardiology

## 2022-05-02 ENCOUNTER — Ambulatory Visit (INDEPENDENT_AMBULATORY_CARE_PROVIDER_SITE_OTHER): Payer: Medicare PPO

## 2022-05-02 DIAGNOSIS — B351 Tinea unguium: Secondary | ICD-10-CM

## 2022-05-02 NOTE — Patient Instructions (Signed)

## 2022-05-02 NOTE — Progress Notes (Signed)
Patient presents today for the 6th laser treatment. Diagnosed with onychomycosis by Dr. Jacqualyn Posey.    Toenail most affected bilateral hallux.    All other systems are negative.   Nails were filed thin. Laser therapy was administered to bilateral hallux and patient tolerated the treatment well. All safety precautions were in place.      Patient has completed the recommended laser treatments. He will follow up with Dr. Jacqualyn Posey in 3 months to evaluate progress.

## 2022-05-03 ENCOUNTER — Ambulatory Visit (HOSPITAL_COMMUNITY)
Admission: EM | Admit: 2022-05-03 | Discharge: 2022-05-03 | Disposition: A | Discharge status: Home / Self Care | Payer: Medicare PPO | Attending: Internal Medicine | Admitting: Internal Medicine

## 2022-05-03 ENCOUNTER — Other Ambulatory Visit: Payer: Self-pay

## 2022-05-03 ENCOUNTER — Encounter (HOSPITAL_COMMUNITY): Payer: Self-pay | Admitting: Emergency Medicine

## 2022-05-03 DIAGNOSIS — N3001 Acute cystitis with hematuria: Secondary | ICD-10-CM | POA: Diagnosis not present

## 2022-05-03 DIAGNOSIS — R310 Gross hematuria: Secondary | ICD-10-CM | POA: Diagnosis not present

## 2022-05-03 LAB — POCT URINALYSIS DIPSTICK, ED / UC
Glucose, UA: 100 mg/dL — AB
Ketones, ur: 15 mg/dL — AB
Nitrite: NEGATIVE
Protein, ur: >=300 mg/dL — AB
Specific Gravity, Urine: 1.015 (ref 1.005–1.030)
Urobilinogen, UA: 4 mg/dL — ABNORMAL HIGH (ref 0.0–1.0)
pH: 5 (ref 5.0–8.0)

## 2022-05-03 MED ORDER — CIPROFLOXACIN HCL 500 MG PO TABS: 500.0000 mg | ORAL_TABLET | Freq: Two times a day (BID) | ORAL | 0 refills | Status: AC

## 2022-05-03 MED ORDER — CIPROFLOXACIN HCL 500 MG PO TABS: 500.0000 mg | ORAL_TABLET | Freq: Two times a day (BID) | ORAL | 0 refills | Status: DC

## 2022-05-03 NOTE — Discharge Instructions (Addendum)
Take ciprofloxacin every 12 hours for the next 5 days to treat your urinary tract infection.  Please schedule an appointment with your primary care provider for the next 2 to 3 days to repeat your urinalysis and ensure that your urine is improving.   I would also like for you to follow-up with [facility] for ongoing evaluation of your BPH.   If your urine does not start to get more clear in the next 2 to 3 days, or if you develop fever, chills, nausea, vomiting, abdominal pain, or back pain, I would like for you to return to urgent care as soon as possible for reevaluation.  If your symptoms are severe, please go to the nearest ER.  I hope you feel better!

## 2022-05-03 NOTE — ED Provider Notes (Signed)
[facility]    CSN: [id number] Arrival date & time: 05/03/22  1643      History   Chief Complaint Chief Complaint  Patient presents with   Hematuria    HPI Frederick Glover is a 82 y.o. male.   Patient presents to urgent care for evaluation of gross hematuria that he first noticed today when using the restroom.  He has a history of BPH but has not been to see urology in a few years as this has been stable.  He also has a history of urinary incontinence and states that he is still able to make it to the bathroom on time most of the time but sometimes has urinary urgency causing him to have accidents sometimes.  He is not currently experiencing any dysuria, urinary urgency, urinary hesitancy, abdominal pain, flank pain, low back pain, nausea, vomiting, dizziness, headache, fever/chills, or heart palpitations.  No recent antibiotic or steroid use reported.  He denies pain with defecation, diarrhea, blood/mucus to the stools, and constipation.  States his urine has cleared up slightly since drinking plenty of water today.  Denies frequent use of urinary irritants/caffeine/alcohol.  He has never been a smoker and denies drug use.  He used to take blood thinners, however no longer needs to due to history of atrial fibrillation ablation.  Denies frequent use of NSAID therapy.  He has not attempted use of any over-the-counter medications before coming to urgent care.   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 Authorizing Provider  acyclovir (ZOVIRAX) 400 MG tablet 2 tablets twice a day for 5 days as needed for flare of fever blister 01/31/13   Jettie Booze, MD  aspirin 81 MG tablet Take 81 mg by mouth daily.    [provider]  ciprofloxacin (CIPRO) 500 MG tablet Take 1 tablet (500 mg total) by mouth every 12 (twelve) hours for 5 days. 05/03/22 05/08/22  Talbot Grumbling, FNP  fexofenadine (ALLEGRA) 180 MG tablet Take 180 mg by mouth daily. allergies    [provider]  fluoruracil (CARAC) 0.5 % cream Apply to skin twice daily for 2 weeks as needed for pre-cancerous skin lesions 01/31/13   Jettie Booze, MD  FLUZONE HIGH-DOSE 0.5 ML SUSY  12/09/13   [provider]  Glucosamine-Chondroit-Vit C-Mn (GLUCOSAMINE-CHONDROITIN) CAPS Take 2 capsules by mouth daily.     [provider]  mometasone (NASONEX) 50 MCG/ACT nasal spray Place 2 sprays into the nose as needed (allergies).     [provider]  Multiple Vitamins-Minerals (MULTIVITAL) tablet Take 1 tablet by mouth daily.      [provider]  Tavaborole (KERYDIN) 5 % SOLN Apply 1 drop topically daily. Apply 1 drop to the toenail daily. 03/14/22   Trula Slade, DPM    Family History Family History  Problem Relation Age of Onset   Heart attack Brother    Stroke Mother    Parkinsonism Father    Atrial fibrillation Father    Hypertension Neg Hx     Social History Social  History   Tobacco Use   Smoking status: Never   Smokeless tobacco: Never  Substance Use Topics   Alcohol use: Yes    Comment: rare etoh    Drug use: No     Allergies   Patient has no known allergies.   Review of Systems Review of Systems  Genitourinary:  Positive for hematuria.  Per HPI   Physical Exam Triage Vital Signs ED Triage Vitals  Enc Vitals Group     BP 05/03/22 1818 (!) 159/94     Pulse Rate 05/03/22 1818 80     Resp 05/03/22 1818 20     Temp 05/03/22 1818 98.2 F (36.8 C)     Temp Source 05/03/22 1818 Oral     SpO2 05/03/22 1818 97 %     Weight --      Height --      Head Circumference --      Peak Flow --      Pain Score 05/03/22 1817 0     Pain Loc --      Pain Edu? --      Excl. in [place]? --    No data found.  Updated Vital Signs BP (!) 159/94 (BP Location: Left Arm)   Pulse 80   Temp 98.2 F (36.8 C) (Oral)   Resp 20   SpO2 97%   Visual Acuity Right Eye Distance:   Left Eye Distance:   Bilateral Distance:    Right Eye Near:   Left Eye Near:    Bilateral Near:     Physical Exam Vitals and nursing note reviewed.  Constitutional:      Appearance: He is not ill-appearing or toxic-appearing.  HENT:     Head: Normocephalic and atraumatic.     Right Ear: Hearing, tympanic membrane, ear canal and external ear normal.     Left Ear: Hearing, tympanic membrane, ear canal and external ear normal.     Nose: Nose normal.     Mouth/Throat:     Lips: Pink.  Eyes:     General: Lids are normal. Vision grossly intact. Gaze aligned appropriately.     Extraocular Movements: Extraocular movements intact.     Conjunctiva/sclera: Conjunctivae normal.  Cardiovascular:     Rate and Rhythm: Normal rate and regular rhythm.     Heart sounds: Normal heart sounds, S1 normal and S2 normal.  Pulmonary:     Effort: Pulmonary effort is normal. No respiratory distress.     Breath sounds: Normal breath sounds and air entry.  Musculoskeletal:     Cervical  back: Neck supple.  Skin:    General: Skin is warm and dry.     Capillary Refill: Capillary refill  takes less than 2 seconds.     Findings: No rash.  Neurological:     General: No focal deficit present.     Mental Status: He is alert and oriented to person, place, and time. Mental status is at baseline.     Cranial Nerves: No dysarthria or facial asymmetry.  Psychiatric:        Mood and Affect: Mood normal.        Speech: Speech normal.        Behavior: Behavior normal.        Thought Content: Thought content normal.        Judgment: Judgment normal.      UC Treatments / Results  Labs (all labs ordered are listed, but only abnormal results are displayed) Labs Reviewed  POCT URINALYSIS DIPSTICK, ED / UC - Abnormal; Notable for the following components:      Result Value   Glucose, UA 100 (*)    Bilirubin Urine LARGE (*)    Ketones, ur 15 (*)    Hgb urine dipstick LARGE (*)    Protein, ur >=300 (*)    Urobilinogen, UA 4.0 (*)    Leukocytes,Ua LARGE (*)    All other components within normal limits  URINE CULTURE    EKG   Radiology No results found.  Procedures Procedures (including critical care time)  Medications Ordered in UC Medications - No data to display  Initial Impression / Assessment and Plan / UC Course  I have reviewed the triage vital signs and the nursing notes.  Pertinent labs & imaging results that were available during my care of the patient were reviewed by me and considered in my medical decision making (see chart for details).   1.  Gross hematuria, acute cystitis with hematuria Urine appears to be tea colored.  Urinalysis indicates possible urinary tract infection.  Given patient's history of BPH, I would like to cover him for possible acute bacterial prostatitis as well. Ciprofloxacin sent to pharmacy to be taken as directed for the next 5 days.  Urine culture is pending, will call patient if results of urine culture indicate need for change in  treatment plan.  He is not exhibiting any systemic symptoms at this time and I have low suspicion for acute nephrolithiasis/acute kidney injury etiology of patient's symptoms.  Lab work reviewed from previous visit with primary care in March 2023 showing stable kidney function with GFR greater than 60 at that time.  Patient does not have a history of kidney problems.  Strict ER and urgent care return precautions discussed.  Advised patient to follow-up with his PCP in the next 2 to 3 days to ensure that his urine has improved.  Advised patient to drink plenty of water to stay well-hydrated and flush out the kidneys.  He is agreeable with this plan.  Discussed physical exam and available lab work findings in clinic with patient.  Counseled patient regarding appropriate use of medications and potential side effects for all medications recommended or prescribed today. Discussed red flag signs and symptoms of worsening condition,when to call the PCP office, return to urgent care, and when to seek higher level of care in the emergency department. Patient verbalizes understanding and agreement with plan. All questions answered. Patient discharged in stable condition.    Final Clinical Impressions(s) / UC Diagnoses   Final diagnoses:  Gross hematuria  Acute cystitis with hematuria     Discharge Instructions      Take ciprofloxacin every 12  hours for the next 5 days to treat your urinary tract infection.  Please schedule an appointment with your primary care provider for the next 2 to 3 days to repeat your urinalysis and ensure that your urine is improving.   I would also like for you to follow-up with [facility] for ongoing evaluation of your BPH.   If your urine does not start to get more clear in the next 2 to 3 days, or if you develop fever, chills, nausea, vomiting, abdominal pain, or back pain, I would like for you to return to urgent care as soon as possible for reevaluation.   If your symptoms are severe, please go to the nearest ER.  I hope you feel better!   ED Prescriptions     Medication Sig Dispense Auth. Provider   ciprofloxacin (CIPRO) 500 MG tablet  (Status: Discontinued) Take 1 tablet (500 mg total) by mouth every 12 (twelve) hours for 5 days. 10 tablet Joella Prince M, FNP   ciprofloxacin (CIPRO) 500 MG tablet Take 1 tablet (500 mg total) by mouth every 12 (twelve) hours for 5 days. 10 tablet Talbot Grumbling, FNP      PDMP not reviewed this encounter.   Talbot Grumbling, [place] 05/03/22 1929

## 2022-05-03 NOTE — ED Triage Notes (Signed)
Patient noticed blood in urine today.  Denies pain.  No history of this prior to today

## 2022-05-04 LAB — URINE CULTURE: Culture: NO GROWTH

## 2022-06-06 NOTE — Progress Notes (Unsigned)
Cardiology Office Note   Date:  06/07/2022   ID:  Frederick Glover, DOB Mar 09, 1941, MRN [id number]  PCP:  Johna Roles, PA    No chief complaint on file.  PAF  Wt Readings from Last 3 Encounters:  06/07/22 189 lb 9.6 oz (86 kg)  01/20/21 199 lb 9.6 oz (90.5 kg)  12/24/19 198 lb 12.8 oz (90.2 kg)       History of Present Illness: Frederick Glover is a 82 y.o. male   who has had PAF. He saw Dr. Rayann Heman who recommended that he come off of coumadin several years ago.  He had an AFib ablation in 2011.   [employer] screening in 2013.  Mild RICA plaque.  Increased Right ABI.     He has had borderline BP in the past.  He has tried to be more active and check his BP at home more regularly.  Readings have been better since he lost weight through diet control.  He stopped eating late at night.  He tries to fast for at least 10 hours a day.     His grandson passed away in [place] in 12-22-22, while studying abroad.  He died of methanol poisoning.    He had a colonoscopy at [facility] with polypectomy. Everything turned out well.    He had his COVID shots.   Walking limited by knee and hip pain. Started PT. Sees Dr. Mardelle Matte.  Wife with IPF and this has caused increased stress.  She is now on oxygen continuously.    He had a UTI.     Past Medical History:  Diagnosis Date   Allergic rhinitis    Atrial fibrillation (HCC)    atrial flutter PVI/CTI ablation 7/11   BPH (benign prostatic hyperplasia)    Chest pain    DJD (degenerative joint disease)    Hearing loss    Herpes simplex    , left butt   Plantar fasciitis    Rheumatic fever    denies h/o    Tinnitus     Past Surgical History:  Procedure Laterality Date   afib/atrial flutter ablation  7/11   afib ablation by JA   arthroscopic knee surgery     HERNIA REPAIR     LAMINECTOMY     s/p appy     VASECTOMY       Current Outpatient Medications  Medication Sig Dispense Refill   acyclovir (ZOVIRAX) 400 MG tablet 2 tablets  twice a day for 5 days as needed for flare of fever blister     aspirin 81 MG tablet Take 81 mg by mouth daily.     fexofenadine (ALLEGRA) 180 MG tablet Take 180 mg by mouth daily. allergies PRN     fluoruracil (CARAC) 0.5 % cream Apply to skin twice daily for 2 weeks as needed for pre-cancerous skin lesions 30 g 0   FLUZONE HIGH-DOSE 0.5 ML SUSY   0   Glucosamine-Chondroit-Vit C-Mn (GLUCOSAMINE-CHONDROITIN) CAPS Take 2 capsules by mouth daily.      mometasone (NASONEX) 50 MCG/ACT nasal spray Place 2 sprays into the nose as needed (allergies).      Multiple Vitamins-Minerals (MULTIVITAL) tablet Take 1 tablet by mouth daily.       Tavaborole (KERYDIN) 5 % SOLN Apply 1 drop topically daily. Apply 1 drop to the toenail daily. 10 mL 2   fluticasone (FLONASE ALLERGY RELIEF) 50 MCG/ACT nasal spray 1 spray in each nostril Nasally once a day if needed  metroNIDAZOLE (METROCREAM) A999333 % cream 1 application to affected area Externally Twice a day for rosacea on nose & chin     No current facility-administered medications for this visit.    Allergies:   Patient has no known allergies.    Social History:  The patient  reports that he has never smoked. He has never used smokeless tobacco. He reports current alcohol use. He reports that he does not use drugs.   Family History:  The patient's family history includes Atrial fibrillation in his father; Heart attack in his brother; Parkinsonism in his father; Stroke in his mother.    ROS:  Please see the history of present illness.   Otherwise, review of systems are positive for increased stress.   All other systems are reviewed and negative.    PHYSICAL EXAM: VS:  BP 132/86   Pulse 88   Ht 5\' 10"  (1.778 m)   Wt 189 lb 9.6 oz (86 kg)   SpO2 97%   BMI 27.20 kg/m  , BMI Body mass index is 27.2 kg/m. GEN: Well nourished, well developed, in no acute distress HEENT: normal Neck: no JVD, carotid bruits, or masses Cardiac: RRR; no murmurs, rubs, or  gallops,no edema  Respiratory:  clear to auscultation bilaterally, normal work of breathing GI: soft, nontender, nondistended, + BS MS: no deformity or atrophy Skin: warm and dry, no rash Neuro:  Strength and sensation are intact Psych: euthymic mood, full affect   EKG:   The ekg ordered today demonstrates NSR, inferior T wave inversion- more pronounced than prior   Recent Labs: No results found for requested labs within last 365 days.   Lipid Panel No results found for: "CHOL", "TRIG", "HDL", "CHOLHDL", "VLDL", "LDLCALC", "LDLDIRECT"   Other studies Reviewed: Additional studies/ records that were reviewed today with results demonstrating: Cr 0.81 in 2023.   ASSESSMENT AND PLAN:  PAF: In NSR.  Off of anticoagulation.  Chest pain: given some RF for CAD, plan for stress test.  Will plan for PET/CT.   Carotid disease: Mild in the past.  Elevated blood pressure at prior visits.  Controled today.   Atypical chest pain noted in 2022.  Symptoms went away so no ischemic testing was done.  Now sx are worse will plan ischemic testing.    Current medicines are reviewed at length with the patient today.  The patient concerns regarding his medicines were addressed.  The following changes have been made:  No change  Labs/ tests ordered today include:  No orders of the defined types were placed in this encounter.   Recommend 150 minutes/week of aerobic exercise Low fat, low carb, high fiber diet recommended  Disposition:   FU in 1 year or sooner depending on PET/CT result   Signed, Larae Grooms, MD  06/07/2022 4:43 PM    [facility] [address], [place], [place]  [address] Phone: [phone]; Fax: [phone]

## 2022-06-07 ENCOUNTER — Ambulatory Visit: Payer: Medicare PPO | Attending: Interventional Cardiology | Admitting: Interventional Cardiology

## 2022-06-07 VITALS — BP 132/86 | HR 88 | Ht 70.0 in | Wt 189.6 lb

## 2022-06-07 DIAGNOSIS — I480 Paroxysmal atrial fibrillation: Secondary | ICD-10-CM | POA: Diagnosis not present

## 2022-06-07 DIAGNOSIS — I6521 Occlusion and stenosis of right carotid artery: Secondary | ICD-10-CM

## 2022-06-07 DIAGNOSIS — R072 Precordial pain: Secondary | ICD-10-CM | POA: Diagnosis not present

## 2022-06-07 DIAGNOSIS — R030 Elevated blood-pressure reading, without diagnosis of hypertension: Secondary | ICD-10-CM | POA: Diagnosis not present

## 2022-06-07 NOTE — Patient Instructions (Addendum)
Medication Instructions:  Your physician recommends that you continue on your current medications as directed. Please refer to the Current Medication list given to you today.  *If you need a refill on your cardiac medications before your next appointment, please call your pharmacy*   Lab Work: none If you have labs (blood work) drawn today and your tests are completely normal, you will receive your results only by: [place] (if you have MyChart) OR A paper copy in the mail If you have any lab test that is abnormal or we need to change your treatment, we will call you to review the results.   Testing/Procedures: Dr Irish Lack recommends you have a Cardiac PET Scan   Follow-Up: At [facility], you and your health needs are our priority.  As part of our continuing mission to provide you with exceptional heart care, we have created designated Provider Care Teams.  These Care Teams include your primary Cardiologist (physician) and Advanced Practice Providers (APPs -  Physician Assistants and Nurse Practitioners) who all work together to provide you with the care you need, when you need it.  We recommend signing up for the patient portal called "MyChart".  Sign up information is provided on this After Visit Summary.  MyChart is used to connect with patients for Virtual Visits (Telemedicine).  Patients are able to view lab/test results, encounter notes, upcoming appointments, etc.  Non-urgent messages can be sent to your provider as well.   To learn more about what you can do with MyChart, go to [email or web address].    Your next appointment:   12 month(s)  Provider:   Larae Grooms, MD     Other Instructions How to Prepare for Your Cardiac PET/CT Stress Test:  1. Please do not take these medications before your test:   Medications that may interfere with the cardiac pharmacological stress agent (ex. nitrates - including erectile dysfunction medications, isosorbide  mononitrate, tamulosin or beta-blockers) the day of the exam. (Erectile dysfunction medication should be held for at least 72 hrs prior to test) Theophylline containing medications for 12 hours. Dipyridamole 48 hours prior to the test. Your remaining medications may be taken with water.  2. Nothing to eat or drink, except water, 3 hours prior to arrival time.   NO caffeine/decaffeinated products, or chocolate 12 hours prior to arrival.  3. NO perfume, cologne or lotion  4. Total time is 1 to 2 hours; you may want to bring reading material for the waiting time.  5. Please report to Radiology at the [facility] Main Entrance 30 minutes early for your test.  [place], [place] [address]  In preparation for your appointment, medication and supplies will be purchased.  Appointment availability is limited, so if you need to cancel or reschedule, please call the Radiology Department at [phone]  24 hours in advance to avoid a cancellation fee of $100.00  What to Expect After you Arrive:  Once you arrive and check in for your appointment, you will be taken to a preparation room within the Radiology Department.  A technologist or Nurse will obtain your medical history, verify that you are correctly prepped for the exam, and explain the procedure.  Afterwards,  an IV will be started in your arm and electrodes will be placed on your skin for EKG monitoring during the stress portion of the exam. Then you will be escorted to the PET/CT scanner.  There, staff will get you positioned on the scanner and  obtain a blood pressure and EKG.  During the exam, you will continue to be connected to the EKG and blood pressure machines.  A small, safe amount of a radioactive tracer will be injected in your IV to obtain a series of pictures of your heart along with an injection of a stress agent.    After your Exam:  It is recommended that you eat a meal and drink a caffeinated beverage to  counter act any effects of the stress agent.  Drink plenty of fluids for the remainder of the day and urinate frequently for the first couple of hours after the exam.  Your doctor will inform you of your test results within 7-10 business days.  For questions about your test or how to prepare for your test, please call: Marchia Bond, Cardiac Imaging Nurse Navigator  Gordy Clement, Cardiac Imaging Nurse Navigator Office: [phone]

## 2022-06-13 ENCOUNTER — Ambulatory Visit (INDEPENDENT_AMBULATORY_CARE_PROVIDER_SITE_OTHER): Payer: Medicare PPO

## 2022-06-13 DIAGNOSIS — B351 Tinea unguium: Secondary | ICD-10-CM

## 2022-06-14 ENCOUNTER — Encounter (HOSPITAL_COMMUNITY): Payer: Self-pay

## 2022-06-14 ENCOUNTER — Inpatient Hospital Stay (HOSPITAL_COMMUNITY)
Admission: EM | Admit: 2022-06-14 | Discharge: 2022-06-16 | DRG: 696 | Disposition: A | Discharge status: Home / Self Care | Payer: Medicare PPO | Attending: Family Medicine | Admitting: Family Medicine

## 2022-06-14 ENCOUNTER — Emergency Department (HOSPITAL_COMMUNITY): Payer: Medicare PPO

## 2022-06-14 ENCOUNTER — Other Ambulatory Visit: Payer: Self-pay

## 2022-06-14 DIAGNOSIS — Z7982 Long term (current) use of aspirin: Secondary | ICD-10-CM

## 2022-06-14 DIAGNOSIS — J309 Allergic rhinitis, unspecified: Secondary | ICD-10-CM | POA: Diagnosis present

## 2022-06-14 DIAGNOSIS — Z79899 Other long term (current) drug therapy: Secondary | ICD-10-CM

## 2022-06-14 DIAGNOSIS — Z820 Family history of epilepsy and other diseases of the nervous system: Secondary | ICD-10-CM

## 2022-06-14 DIAGNOSIS — E876 Hypokalemia: Secondary | ICD-10-CM | POA: Diagnosis present

## 2022-06-14 DIAGNOSIS — R310 Gross hematuria: Secondary | ICD-10-CM | POA: Diagnosis not present

## 2022-06-14 DIAGNOSIS — Z823 Family history of stroke: Secondary | ICD-10-CM

## 2022-06-14 DIAGNOSIS — I10 Essential (primary) hypertension: Secondary | ICD-10-CM | POA: Diagnosis present

## 2022-06-14 DIAGNOSIS — H9190 Unspecified hearing loss, unspecified ear: Secondary | ICD-10-CM | POA: Diagnosis present

## 2022-06-14 DIAGNOSIS — N3289 Other specified disorders of bladder: Secondary | ICD-10-CM | POA: Diagnosis present

## 2022-06-14 DIAGNOSIS — R319 Hematuria, unspecified: Secondary | ICD-10-CM | POA: Diagnosis present

## 2022-06-14 DIAGNOSIS — R030 Elevated blood-pressure reading, without diagnosis of hypertension: Secondary | ICD-10-CM | POA: Diagnosis present

## 2022-06-14 DIAGNOSIS — N400 Enlarged prostate without lower urinary tract symptoms: Secondary | ICD-10-CM | POA: Diagnosis present

## 2022-06-14 DIAGNOSIS — I4891 Unspecified atrial fibrillation: Secondary | ICD-10-CM | POA: Diagnosis present

## 2022-06-14 DIAGNOSIS — N200 Calculus of kidney: Secondary | ICD-10-CM | POA: Diagnosis present

## 2022-06-14 DIAGNOSIS — Z8249 Family history of ischemic heart disease and other diseases of the circulatory system: Secondary | ICD-10-CM

## 2022-06-14 LAB — CBC WITH DIFFERENTIAL/PLATELET
Abs Immature Granulocytes: 0.04 10*3/uL (ref 0.00–0.07)
Basophils Absolute: 0.1 10*3/uL (ref 0.0–0.1)
Basophils Relative: 1 %
Eosinophils Absolute: 0.1 10*3/uL (ref 0.0–0.5)
Eosinophils Relative: 1 %
HCT: 46.4 % (ref 39.0–52.0)
Hemoglobin: 14.9 g/dL (ref 13.0–17.0)
Immature Granulocytes: 1 %
Lymphocytes Relative: 21 %
Lymphs Abs: 1.6 10*3/uL (ref 0.7–4.0)
MCH: 30.8 pg (ref 26.0–34.0)
MCHC: 32.1 g/dL (ref 30.0–36.0)
MCV: 95.9 fL (ref 80.0–100.0)
Monocytes Absolute: 0.8 10*3/uL (ref 0.1–1.0)
Monocytes Relative: 11 %
Neutro Abs: 5.2 10*3/uL (ref 1.7–7.7)
Neutrophils Relative %: 65 %
Platelets: 154 10*3/uL (ref 150–400)
RBC: 4.84 MIL/uL (ref 4.22–5.81)
RDW: 14.6 % (ref 11.5–15.5)
WBC: 7.8 10*3/uL (ref 4.0–10.5)
nRBC: 0 % (ref 0.0–0.2)

## 2022-06-14 LAB — BASIC METABOLIC PANEL
Anion gap: 10 (ref 5–15)
BUN: 29 mg/dL — ABNORMAL HIGH (ref 8–23)
CO2: 19 mmol/L — ABNORMAL LOW (ref 22–32)
Calcium: 8.6 mg/dL — ABNORMAL LOW (ref 8.9–10.3)
Chloride: 109 mmol/L (ref 98–111)
Creatinine, Ser: 0.74 mg/dL (ref 0.61–1.24)
GFR, Estimated: >60 mL/min (ref >60)
Glucose, Bld: 76 mg/dL (ref 70–99)
Potassium: 3.5 mmol/L (ref 3.5–5.1)
Sodium: 138 mmol/L (ref 135–145)

## 2022-06-14 LAB — URINALYSIS, ROUTINE W REFLEX MICROSCOPIC
Bilirubin Urine: NEGATIVE
Glucose, UA: NEGATIVE mg/dL
Ketones, ur: NEGATIVE mg/dL
Leukocytes,Ua: NEGATIVE
Nitrite: NEGATIVE
Protein, ur: NEGATIVE mg/dL
RBC / HPF: >50 RBC/hpf (ref 0–5)
Specific Gravity, Urine: 1.004 — ABNORMAL LOW (ref 1.005–1.030)
pH: 5 (ref 5.0–8.0)

## 2022-06-14 MED ORDER — IOHEXOL 300 MG/ML  SOLN
100.0000 mL | Freq: Once | INTRAMUSCULAR | Status: AC | PRN
  Administered 2022-06-14: 100 mL via INTRAVENOUS

## 2022-06-14 MED ORDER — SODIUM CHLORIDE 0.9 % IR SOLN
3000.0000 mL | Status: DC
  Administered 2022-06-14 – 2022-06-15 (×13): 3000 mL

## 2022-06-14 MED ORDER — SODIUM CHLORIDE 0.9 % IV SOLN
1.0000 g | Freq: Once | INTRAVENOUS | Status: AC
  Administered 2022-06-14: 1 g via INTRAVENOUS
  Filled 2022-06-14: qty 10

## 2022-06-14 NOTE — ED Provider Triage Note (Signed)
Emergency Medicine Provider Triage Evaluation Note  Frederick Glover , a 82 y.o. male  was evaluated in triage.  Pt complains of frank hematuria.  Patient states that approxi-1 month ago he had an episode of hematuria and was evaluated in urgent care, treated with Cipro for urinary tract infection.  He states his symptoms subsided.  He states that this morning he noticed a large amount of blood in the toilet when he urinated including a clot.  He went to urology who placed a Foley catheter.  They flushed it and had him drink water.  Subsequent urine was reportedly filled with lots of blood.  Urology recommended the patient come to the emergency department for further evaluation and possible admission.  Review of Systems  Positive: As above Negative: As above  Physical Exam  BP (!) 144/98   Pulse 88   Temp 97.8 F (36.6 C) (Oral)   Resp 18   Ht 5\' 10"  (1.778 m)   Wt 86.2 kg   SpO2 100%   BMI 27.26 kg/m  Gen:   Awake, no distress   Resp:  Normal effort  MSK:   Moves extremities without difficulty  Other:    Medical Decision Making  Medically screening exam initiated at 4:35 PM.  Appropriate orders placed.  Frederick Glover was informed that the remainder of the evaluation will be completed by another provider, this initial triage assessment does not replace that evaluation, and the importance of remaining in the ED until their evaluation is complete.     Dorothyann Peng, PA-C 06/14/22 1636

## 2022-06-14 NOTE — H&P (Signed)
History and Physical    Patient: Frederick Glover [id number] DOB: 1940-06-28 DOA: 06/14/2022 DOS: the patient was seen and examined on 06/14/2022 PCP: Johna Roles, PA  Patient coming from: {Point_of_Origin:[address]}  Chief Complaint:  Chief Complaint  Patient presents with   Hematuria   HPI: Frederick Glover is a 82 y.o. male with medical history significant of ***  Review of Systems: {ROS_Text:26778} Past Medical History:  Diagnosis Date   Allergic rhinitis    Atrial fibrillation    atrial flutter PVI/CTI ablation 7/11   BPH (benign prostatic hyperplasia)    Chest pain    DJD (degenerative joint disease)    Hearing loss    Herpes simplex    , left butt   Plantar fasciitis    Rheumatic fever    denies h/o    Tinnitus    Past Surgical History:  Procedure Laterality Date   afib/atrial flutter ablation  7/11   afib ablation by JA   arthroscopic knee surgery     HERNIA REPAIR     LAMINECTOMY     s/p appy     VASECTOMY     Social History:  reports that he has never smoked. He has never used smokeless tobacco. He reports current alcohol use. He reports that he does not use drugs.  No Known Allergies  Family History  Problem Relation Age of Onset   Heart attack Brother    Stroke Mother    Parkinsonism Father    Atrial fibrillation Father    Hypertension Neg Hx     Prior to Admission medications   Medication Sig Start Date End Date Taking? Authorizing Provider  acyclovir (ZOVIRAX) 400 MG tablet 2 tablets twice a day for 5 days as needed for flare of fever blister 01/31/13   Jettie Booze, MD  aspirin 81 MG tablet Take 81 mg by mouth daily.    [provider]  fexofenadine (ALLEGRA) 180 MG tablet Take 180 mg by mouth daily. allergies PRN    [provider]  fluoruracil (CARAC) 0.5 % cream Apply to skin twice daily for 2 weeks as needed for pre-cancerous skin lesions 01/31/13   Jettie Booze, MD  fluticasone [facility] ALLERGY RELIEF)  50 MCG/ACT nasal spray 1 spray in each nostril Nasally once a day if needed    [provider]  FLUZONE HIGH-DOSE 0.5 ML SUSY  12/09/13   [provider]  Glucosamine-Chondroit-Vit C-Mn (GLUCOSAMINE-CHONDROITIN) CAPS Take 2 capsules by mouth daily.     [provider]  metroNIDAZOLE (METROCREAM) A999333 % cream 1 application to affected area Externally Twice a day for rosacea on nose & chin    [provider]  mometasone (NASONEX) 50 MCG/ACT nasal spray Place 2 sprays into the nose as needed (allergies).     [provider]  Multiple Vitamins-Minerals (MULTIVITAL) tablet Take 1 tablet by mouth daily.      [provider]  Tavaborole (KERYDIN) 5 % SOLN Apply 1 drop topically daily. Apply 1 drop to the toenail daily. 03/14/22   Trula Slade, DPM    Physical Exam: Vitals:   06/14/22 1555 06/14/22 1830 06/14/22 2100 06/14/22 2130  BP: (!) 144/98 (!) 120/94  (!) 142/129  Pulse: 88 (!) 57  64  Resp: 18 17  17   Temp: 97.8 F (36.6 C)  97.8 F (36.6 C)   TempSrc: Oral     SpO2: 100% 98%  98%  Weight:      Height:       ***  Data Reviewed: {Tip this will not be part of the note when signed- Document your independent interpretation of telemetry tracing, EKG, lab, Radiology test or any other diagnostic tests. Add any new diagnostic test ordered today. (Optional):26781} {Results:26384}  Assessment and Plan: No notes have been filed under this hospital service. Service: Hospitalist     Advance Care Planning:   Code Status: Not on file ***  Consults: ***  Family Communication: ***  Severity of Illness: {Observation/Inpatient:21159}  AuthorBarbette Merino, MD 06/14/2022 9:43 PM  For on call review [email or web address].

## 2022-06-14 NOTE — ED Provider Notes (Signed)
[facility] AT [facility] Provider Note   CSN: [id number] Arrival date & time: 06/14/22  1547     History  Chief Complaint  Patient presents with   Hematuria    Frederick Glover is a 82 y.o. male presenting to the ED from the urology office with concern for gross hematuria.  The patient reports similar symptoms a month ago and was treated for UTI with ciprofloxacin and it resolved.  Today began having painless hematuria this morning, went to see a urologist in the office, where he had bladder irrigation performed.  The reports that he was feeling up his Foley bag quickly with bloody urine, and advised to come to the ED.  He reports some mild lightheadedness.  He is on a baby aspirin, no anticoagulation.  He denies any known history of bladder malignancy.  He denies abdominal pain.  HPI     Home Medications Prior to Admission medications   Medication Sig Start Date End Date Taking? Authorizing Provider  acyclovir (ZOVIRAX) 400 MG tablet 2 tablets twice a day for 5 days as needed for flare of fever blister 01/31/13   Jettie Booze, MD  aspirin 81 MG tablet Take 81 mg by mouth daily.    [provider]  fexofenadine (ALLEGRA) 180 MG tablet Take 180 mg by mouth daily. allergies PRN    [provider]  fluoruracil (CARAC) 0.5 % cream Apply to skin twice daily for 2 weeks as needed for pre-cancerous skin lesions 01/31/13   Jettie Booze, MD  fluticasone [facility] ALLERGY RELIEF) 50 MCG/ACT nasal spray 1 spray in each nostril Nasally once a day if needed    [provider]  FLUZONE HIGH-DOSE 0.5 ML SUSY  12/09/13   [provider]  Glucosamine-Chondroit-Vit C-Mn (GLUCOSAMINE-CHONDROITIN) CAPS Take 2 capsules by mouth daily.     [provider]  metroNIDAZOLE (METROCREAM) A999333 % cream 1 application to affected area Externally Twice a day for rosacea on nose & chin    [provider]  mometasone  (NASONEX) 50 MCG/ACT nasal spray Place 2 sprays into the nose as needed (allergies).     [provider]  Multiple Vitamins-Minerals (MULTIVITAL) tablet Take 1 tablet by mouth daily.      [provider]  Tavaborole (KERYDIN) 5 % SOLN Apply 1 drop topically daily. Apply 1 drop to the toenail daily. 03/14/22   Trula Slade, DPM      Allergies    Patient has no known allergies.    Review of Systems   Review of Systems  Physical Exam Updated Vital Signs BP (!) 120/94   Pulse (!) 57   Temp 97.8 F (36.6 C) (Oral)   Resp 17   Ht 5\' 10"  (1.778 m)   Wt 86.2 kg   SpO2 98%   BMI 27.26 kg/m  Physical Exam Constitutional:      General: He is not in acute distress. HENT:     Head: Normocephalic and atraumatic.  Eyes:     Conjunctiva/sclera: Conjunctivae normal.     Pupils: Pupils are equal, round, and reactive to light.  Cardiovascular:     Rate and Rhythm: Normal rate and regular rhythm.  Pulmonary:     Effort: Pulmonary effort is normal. No respiratory distress.  Abdominal:     General: There is no distension.     Tenderness: There is no abdominal tenderness.  Genitourinary:    Comments: Urethral indwelling Foley catheter in place with  frankly bloody urine Skin:    General: Skin is warm and dry.  Neurological:     General: No focal deficit present.     Mental Status: He is alert. Mental status is at baseline.  Psychiatric:        Mood and Affect: Mood normal.        Behavior: Behavior normal.     ED Results / Procedures / Treatments   Labs (all labs ordered are listed, but only abnormal results are displayed) Labs Reviewed  BASIC METABOLIC PANEL - Abnormal; Notable for the following components:      Result Value   CO2 19 (*)    BUN 29 (*)    Calcium 8.6 (*)    All other components within normal limits  URINE CULTURE  CBC WITH DIFFERENTIAL/PLATELET  URINALYSIS, ROUTINE W REFLEX MICROSCOPIC    EKG None  Radiology CT ABDOMEN PELVIS W  CONTRAST  Result Date: 06/14/2022 CLINICAL DATA:  Pilar Plate hematuria EXAM: CT ABDOMEN AND PELVIS WITH CONTRAST TECHNIQUE: Multidetector CT imaging of the abdomen and pelvis was performed using the standard protocol following bolus administration of intravenous contrast. RADIATION DOSE REDUCTION: This exam was performed according to the departmental dose-optimization program which includes automated exposure control, adjustment of the mA and/or kV according to patient size and/or use of iterative reconstruction technique. CONTRAST:  128mL OMNIPAQUE IOHEXOL 300 MG/ML  SOLN COMPARISON:  None Available. FINDINGS: Lower chest: No acute abnormality.  Coronary artery calcification. Hepatobiliary: Hepatic cysts. Unremarkable gallbladder and biliary tree. Pancreas: Fatty atrophy.  No acute abnormality. Spleen: Unremarkable. Adrenals/Urinary Tract: Normal adrenal glands. Nonobstructing 5 mm stone in the upper pole of the right kidney. No obstructing urinary calculi or hydronephrosis. Foley catheter within the bladder. There are a few locules of gas within the anterior bladder wall possibly related to Foley catheter placement. Hyperdense fluid is present within the bladder about the Foley catheter compatible with hemorrhage/clot. Stomach/Bowel: Normal caliber large and small bowel. Colonic diverticulosis without diverticulitis. Stomach is within normal limits. Vascular/Lymphatic: Aortic atherosclerosis. No enlarged abdominal or pelvic lymph nodes. Reproductive: Enlarged prostate. Other: No free intraperitoneal fluid or air. Musculoskeletal: Superior endplate compression deformity of T12 is presumed chronic. Thoracolumbar spondylosis. IMPRESSION: 1. Hyperdense fluid within the bladder about the Foley catheter compatible with hemorrhage/clot. 2. Nonobstructing 5 mm stone in the upper pole of the right kidney. No obstructing urinary calculi or hydronephrosis. 3. Enlarged prostate. 4. Extensive colonic diverticulosis without  evidence of acute diverticulitis. Coronary artery and aortic Atherosclerosis (ICD10-I70.0). Electronically Signed   By: Placido Sou M.D.   On: 06/14/2022 20:39    Procedures Procedures    Medications Ordered in ED Medications  sodium chloride irrigation 0.9 % 3,000 mL (0 mLs Irrigation Stopped 06/14/22 2000)  cefTRIAXone (ROCEPHIN) 1 g in sodium chloride 0.9 % 100 mL IVPB (1 g Intravenous New Bag/Given 06/14/22 2109)  iohexol (OMNIPAQUE) 300 MG/ML solution 100 mL (100 mLs Intravenous Contrast Given 06/14/22 2018)    ED Course/ Medical Decision Making/ A&P Clinical Course as of 06/14/22 2206  Wed Jun 14, 2022  1940 Hemoglobin level is normal [MT]  2206 Dr Nevada Crane from urology at bedside to see the patient [MT]    Clinical Course User Index [MT] Langston Masker Carola Rhine, MD                             Medical Decision Making Amount and/or Complexity of Data Reviewed Labs: ordered. Radiology: ordered.  Risk  Prescription drug management. Decision regarding hospitalization.   This patient presents to the ED with concern for painless hematuria. This involves an extensive number of treatment options, and is a complaint that carries with it a high risk of complications and morbidity.  The differential diagnosis includes infection versus malignancy versus kidney stone versus other  I ordered and personally interpreted labs.  The pertinent results include: Hemoglobin is normal limits.  Creatinine within normal limits  I ordered imaging studies including CT abdomen pelvis I independently visualized and interpreted imaging which showed no emergent cause or explanation for the patient's hematuria I agree with the radiologist interpretation  The patient was maintained on a cardiac monitor.  I personally viewed and interpreted the cardiac monitored which showed an underlying rhythm of: NSR  I ordered medication including any was bladder irrigation  I have reviewed the patients home medicines and  have made adjustments as needed  I requested consultation with the urologist Dr Nevada Crane who had referred the patient in from the clinic today.  He recommends a CT abdomen pelvis, blood test, continued irrigation in the ER and the hospital if no improvement.  After the interventions noted above, I reevaluated the patient and found that they have: stayed the same   After 4 L of continuous bladder irrigation the patient's urine remains dark red, does not appear to be clearing up.  His vital signs are otherwise stable and he is comfortable.  However, given his ongoing hematuria, per my discussion earlier with urologist, we will plan for medical admission for continuous bladder irrigation overnight, repeat hemoglobin check, and also trialing antibiotics with IV Rocephin here, with possibility of cystitis.  Dispostion:  After consideration of the diagnostic results and the patients response to treatment, I feel that the patent would benefit from medical admission         Final Clinical Impression(s) / ED Diagnoses Final diagnoses:  Hematuria, unspecified type    Rx / DC Orders ED Discharge Orders     None         Wyvonnia Dusky, MD 06/14/22 2207

## 2022-06-14 NOTE — ED Triage Notes (Signed)
Patient is here for evaluation of hematuria. Was seen at Urology today and told to come to the ER. Urologist spoke with Dr Armandina Gemma about patient. Pt arrived with foley catheter in place, that was placed at their office earlier.

## 2022-06-14 NOTE — Consult Note (Signed)
Urology Consult   Physician requesting consult: Dr. Jonelle Sidle, MD  Reason for consult: gross hematuria  History of Present Illness: Frederick Glover is a 82 y.o. history of atrial fibrillation s/p ablation on aspirin 81mg , BPH with elevated PSA s/p negative prostate biopsies who presented to [facility] earlier today with gross hematuria. A 24Fr 3 way catheter was placed and irrigated with moderate clot return but shortly after, the urine became bloody again and patient experienced lightheadedness which prompted his visit to the ED.   In the ED, he is afebrile and stable. Labs with WBC 7.8, Hgb 14.9, Cr 0.74. UA with negative leukocyte esterase, negative nitrite, rare bacteria. CT urogram with nonobstructing 85mm right upper pole stone, no hydronephrosis or concerning lesions and hyperdense fluid within bladder surrounding foley catheter concerning for clot. Also notable for enlarged prostate. He was started in CBI in the ED and is s/p a dose of empiric Ceftriaxone.  Patient notes that he had an episode of gross hematuria approximately 1 month ago. He was diagnosed with a UTI at that time and was started on Ciprofloxacin. The hematuria resolved within 1 day.   He is not on any medications for BPH. Has been having a slow stream lately. Reports history of elevated PSA with negative prior prostate biopsies. Followed by Dr. Junious Silk of [facility].  Past Medical History:  Diagnosis Date   Allergic rhinitis    Atrial fibrillation    atrial flutter PVI/CTI ablation 7/11   BPH (benign prostatic hyperplasia)    Chest pain    DJD (degenerative joint disease)    Hearing loss    Herpes simplex    , left butt   Plantar fasciitis    Rheumatic fever    denies h/o    Tinnitus     Past Surgical History:  Procedure Laterality Date   afib/atrial flutter ablation  7/11   afib ablation by JA   arthroscopic knee surgery     HERNIA REPAIR     LAMINECTOMY     s/p appy     [facility] Medications:  Home Meds:  No outpatient medications have been marked as taking for the 06/14/22 encounter [facility] Encounter).    Scheduled Meds: Continuous Infusions:  sodium chloride irrigation 0 mL (06/14/22 2000)   PRN Meds:.  Allergies: No Known Allergies  Family History  Problem Relation Age of Onset   Heart attack Brother    Stroke Mother    Parkinsonism Father    Atrial fibrillation Father    Hypertension Neg Hx     Social History:  reports that he has never smoked. He has never used smokeless tobacco. He reports current alcohol use. He reports that he does not use drugs.  ROS: A complete review of systems was performed.  All systems are negative except for pertinent findings as noted.  Physical Exam:  Vital signs in last 24 hours: Temp:  [97.8 F (36.6 C)] 97.8 F (36.6 C) (04/03 2100) Pulse Rate:  [57-88] 64 (04/03 2130) Resp:  [17-18] 17 (04/03 2130) BP: (120-144)/(94-129) 142/129 (04/03 2130) SpO2:  [98 %-100 %] 98 % (04/03 2130) Weight:  [86.2 kg] 86.2 kg (04/03 1554) Constitutional:  Alert and oriented, No acute distress Cardiovascular: Regular rate Respiratory: Normal respiratory effort, GI: Abdomen is soft, nontender, nondistended, no abdominal masses GU: Foley catheter in place draining cherry red urine with CBI clamped Neurologic: Grossly intact, no focal deficits Psychiatric: Normal mood and affect  Laboratory Data:  Recent Labs    06/14/22 1930  WBC 7.8  HGB 14.9  HCT 46.4  PLT 154    Recent Labs    06/14/22 1930  NA 138  K 3.5  CL 109  GLUCOSE 76  BUN 29*  CALCIUM 8.6*  CREATININE 0.74     Results for orders placed or performed during the hospital encounter of 06/14/22 (from the past 24 hour(s))  Urinalysis, Routine w reflex microscopic -Urine, Catheterized     Status: Abnormal   Collection Time: 06/14/22  4:35 PM  Result Value Ref Range   Color, Urine STRAW (A) YELLOW   APPearance CLEAR CLEAR    Specific Gravity, Urine 1.004 (L) 1.005 - 1.030   pH 5.0 5.0 - 8.0   Glucose, UA NEGATIVE NEGATIVE mg/dL   Hgb urine dipstick LARGE (A) NEGATIVE   Bilirubin Urine NEGATIVE NEGATIVE   Ketones, ur NEGATIVE NEGATIVE mg/dL   Protein, ur NEGATIVE NEGATIVE mg/dL   Nitrite NEGATIVE NEGATIVE   Leukocytes,Ua NEGATIVE NEGATIVE   RBC / HPF >50 0 - 5 RBC/hpf   WBC, UA 6-10 0 - 5 WBC/hpf   Bacteria, UA RARE (A) NONE SEEN   Squamous Epithelial / HPF 0-5 0 - 5 /HPF   Mucus PRESENT   Basic metabolic panel     Status: Abnormal   Collection Time: 06/14/22  7:30 PM  Result Value Ref Range   Sodium 138 135 - 145 mmol/L   Potassium 3.5 3.5 - 5.1 mmol/L   Chloride 109 98 - 111 mmol/L   CO2 19 (L) 22 - 32 mmol/L   Glucose, Bld 76 70 - 99 mg/dL   BUN 29 (H) 8 - 23 mg/dL   Creatinine, Ser 0.74 0.61 - 1.24 mg/dL   Calcium 8.6 (L) 8.9 - 10.3 mg/dL   GFR, Estimated >60 >60 mL/min   Anion gap 10 5 - 15  CBC with Differential     Status: None   Collection Time: 06/14/22  7:30 PM  Result Value Ref Range   WBC 7.8 4.0 - 10.5 K/uL   RBC 4.84 4.22 - 5.81 MIL/uL   Hemoglobin 14.9 13.0 - 17.0 g/dL   HCT 46.4 39.0 - 52.0 %   MCV 95.9 80.0 - 100.0 fL   MCH 30.8 26.0 - 34.0 pg   MCHC 32.1 30.0 - 36.0 g/dL   RDW 14.6 11.5 - 15.5 %   Platelets 154 150 - 400 K/uL   nRBC 0.0 0.0 - 0.2 %   Neutrophils Relative % 65 %   Neutro Abs 5.2 1.7 - 7.7 K/uL   Lymphocytes Relative 21 %   Lymphs Abs 1.6 0.7 - 4.0 K/uL   Monocytes Relative 11 %   Monocytes Absolute 0.8 0.1 - 1.0 K/uL   Eosinophils Relative 1 %   Eosinophils Absolute 0.1 0.0 - 0.5 K/uL   Basophils Relative 1 %   Basophils Absolute 0.1 0.0 - 0.1 K/uL   Immature Granulocytes 1 %   Abs Immature Granulocytes 0.04 0.00 - 0.07 K/uL   No results found for this or any previous visit (from the past 240 hour(s)).  Renal Function: Recent Labs    06/14/22 1930  CREATININE 0.74   Estimated Creatinine Clearance: 72.2 mL/min (by C-G formula based on SCr of  0.74 mg/dL).  Radiologic Imaging: CT ABDOMEN PELVIS W CONTRAST  Result Date: 06/14/2022 CLINICAL DATA:  Pilar Plate hematuria EXAM: CT ABDOMEN AND PELVIS WITH CONTRAST TECHNIQUE: Multidetector CT imaging of the abdomen and pelvis  was performed using the standard protocol following bolus administration of intravenous contrast. RADIATION DOSE REDUCTION: This exam was performed according to the departmental dose-optimization program which includes automated exposure control, adjustment of the mA and/or kV according to patient size and/or use of iterative reconstruction technique. CONTRAST:  125mL OMNIPAQUE IOHEXOL 300 MG/ML  SOLN COMPARISON:  None Available. FINDINGS: Lower chest: No acute abnormality.  Coronary artery calcification. Hepatobiliary: Hepatic cysts. Unremarkable gallbladder and biliary tree. Pancreas: Fatty atrophy.  No acute abnormality. Spleen: Unremarkable. Adrenals/Urinary Tract: Normal adrenal glands. Nonobstructing 5 mm stone in the upper pole of the right kidney. No obstructing urinary calculi or hydronephrosis. Foley catheter within the bladder. There are a few locules of gas within the anterior bladder wall possibly related to Foley catheter placement. Hyperdense fluid is present within the bladder about the Foley catheter compatible with hemorrhage/clot. Stomach/Bowel: Normal caliber large and small bowel. Colonic diverticulosis without diverticulitis. Stomach is within normal limits. Vascular/Lymphatic: Aortic atherosclerosis. No enlarged abdominal or pelvic lymph nodes. Reproductive: Enlarged prostate. Other: No free intraperitoneal fluid or air. Musculoskeletal: Superior endplate compression deformity of T12 is presumed chronic. Thoracolumbar spondylosis. IMPRESSION: 1. Hyperdense fluid within the bladder about the Foley catheter compatible with hemorrhage/clot. 2. Nonobstructing 5 mm stone in the upper pole of the right kidney. No obstructing urinary calculi or hydronephrosis. 3. Enlarged  prostate. 4. Extensive colonic diverticulosis without evidence of acute diverticulitis. Coronary artery and aortic Atherosclerosis (ICD10-I70.0). Electronically Signed   By: Placido Sou M.D.   On: 06/14/2022 20:39    I independently reviewed the above imaging studies.  Impression/Recommendation BPH with symptomatic LUTS Gross hematuria Nephrolithiasis  - Continue foley catheter to gravity drainage with CBI titrated to light pink. - OK for RN staff to flush catheter PRN - Please start patient on tamsulosin 0.4mg  daily and Finasteride 5mg  daily for BPH. - s/p empiric Ceftriaxone. Urine culture pending, will follow up results. - Will need outpatient cystoscopy to complete hematuria workup. - No intervention currently indicated for the nonobstructing right renal stone. Will continue to monitor - Has follow up scheduled on 06/19/22 with Dr. Junious Silk. Will keep that appointment for now, pending hospital stay.  Urology will continue to follow. Please page urology pager with any questions or concerns.  Coralyn Pear, MD [facility] 06/14/2022, 10:55 PM

## 2022-06-14 NOTE — ED Notes (Signed)
4th cbi bag hung

## 2022-06-15 DIAGNOSIS — R310 Gross hematuria: Secondary | ICD-10-CM | POA: Diagnosis present

## 2022-06-15 DIAGNOSIS — N400 Enlarged prostate without lower urinary tract symptoms: Secondary | ICD-10-CM | POA: Diagnosis present

## 2022-06-15 DIAGNOSIS — Z7982 Long term (current) use of aspirin: Secondary | ICD-10-CM | POA: Diagnosis not present

## 2022-06-15 DIAGNOSIS — R319 Hematuria, unspecified: Secondary | ICD-10-CM | POA: Diagnosis present

## 2022-06-15 DIAGNOSIS — R030 Elevated blood-pressure reading, without diagnosis of hypertension: Secondary | ICD-10-CM

## 2022-06-15 DIAGNOSIS — J309 Allergic rhinitis, unspecified: Secondary | ICD-10-CM | POA: Diagnosis present

## 2022-06-15 DIAGNOSIS — I10 Essential (primary) hypertension: Secondary | ICD-10-CM | POA: Diagnosis present

## 2022-06-15 DIAGNOSIS — Z79899 Other long term (current) drug therapy: Secondary | ICD-10-CM | POA: Diagnosis not present

## 2022-06-15 DIAGNOSIS — Z8249 Family history of ischemic heart disease and other diseases of the circulatory system: Secondary | ICD-10-CM | POA: Diagnosis not present

## 2022-06-15 DIAGNOSIS — N3289 Other specified disorders of bladder: Secondary | ICD-10-CM | POA: Diagnosis present

## 2022-06-15 DIAGNOSIS — Z820 Family history of epilepsy and other diseases of the nervous system: Secondary | ICD-10-CM | POA: Diagnosis not present

## 2022-06-15 DIAGNOSIS — I480 Paroxysmal atrial fibrillation: Secondary | ICD-10-CM

## 2022-06-15 DIAGNOSIS — I4891 Unspecified atrial fibrillation: Secondary | ICD-10-CM | POA: Diagnosis present

## 2022-06-15 DIAGNOSIS — H9190 Unspecified hearing loss, unspecified ear: Secondary | ICD-10-CM | POA: Diagnosis present

## 2022-06-15 DIAGNOSIS — N200 Calculus of kidney: Secondary | ICD-10-CM | POA: Diagnosis present

## 2022-06-15 DIAGNOSIS — Z823 Family history of stroke: Secondary | ICD-10-CM | POA: Diagnosis not present

## 2022-06-15 DIAGNOSIS — E876 Hypokalemia: Secondary | ICD-10-CM | POA: Diagnosis present

## 2022-06-15 LAB — CBC
HCT: 38.8 % — ABNORMAL LOW (ref 39.0–52.0)
Hemoglobin: 13.2 g/dL (ref 13.0–17.0)
MCH: 30.2 pg (ref 26.0–34.0)
MCHC: 34 g/dL (ref 30.0–36.0)
MCV: 88.8 fL (ref 80.0–100.0)
Platelets: 151 10*3/uL (ref 150–400)
RBC: 4.37 MIL/uL (ref 4.22–5.81)
RDW: 14.4 % (ref 11.5–15.5)
WBC: 7.4 10*3/uL (ref 4.0–10.5)
nRBC: 0 % (ref 0.0–0.2)

## 2022-06-15 LAB — COMPREHENSIVE METABOLIC PANEL
ALT: 19 U/L (ref 0–44)
AST: 22 U/L (ref 15–41)
Albumin: 3.6 g/dL (ref 3.5–5.0)
Alkaline Phosphatase: 44 U/L (ref 38–126)
Anion gap: 8 (ref 5–15)
BUN: 24 mg/dL — ABNORMAL HIGH (ref 8–23)
CO2: 21 mmol/L — ABNORMAL LOW (ref 22–32)
Calcium: 8.3 mg/dL — ABNORMAL LOW (ref 8.9–10.3)
Chloride: 108 mmol/L (ref 98–111)
Creatinine, Ser: 0.6 mg/dL — ABNORMAL LOW (ref 0.61–1.24)
GFR, Estimated: >60 mL/min (ref >60)
Glucose, Bld: 101 mg/dL — ABNORMAL HIGH (ref 70–99)
Potassium: 3.3 mmol/L — ABNORMAL LOW (ref 3.5–5.1)
Sodium: 137 mmol/L (ref 135–145)
Total Bilirubin: 1 mg/dL (ref 0.3–1.2)
Total Protein: 6.2 g/dL — ABNORMAL LOW (ref 6.5–8.1)

## 2022-06-15 LAB — URINE CULTURE: Culture: NO GROWTH

## 2022-06-15 MED ORDER — ONDANSETRON HCL 4 MG/2ML IJ SOLN: 4.0000 mg | Freq: Four times a day (QID) | INTRAMUSCULAR | Status: DC | PRN

## 2022-06-15 MED ORDER — FINASTERIDE 5 MG PO TABS
5.0000 mg | ORAL_TABLET | Freq: Every day | ORAL | Status: DC
  Administered 2022-06-15 – 2022-06-16 (×2): 5 mg via ORAL
  Filled 2022-06-15 (×2): qty 1

## 2022-06-15 MED ORDER — BICALUTAMIDE 50 MG PO TABS
50.0000 mg | ORAL_TABLET | Freq: Every day | ORAL | Status: DC
  Administered 2022-06-15 – 2022-06-16 (×2): 50 mg via ORAL
  Filled 2022-06-15 (×2): qty 1

## 2022-06-15 MED ORDER — LORATADINE 10 MG PO TABS
10.0000 mg | ORAL_TABLET | Freq: Every day | ORAL | Status: DC
  Administered 2022-06-16: 10 mg via ORAL
  Filled 2022-06-15 (×2): qty 1

## 2022-06-15 MED ORDER — ACETAMINOPHEN 325 MG PO TABS
650.0000 mg | ORAL_TABLET | ORAL | Status: AC | PRN
  Administered 2022-06-15 (×4): 650 mg via ORAL
  Filled 2022-06-15 (×4): qty 2

## 2022-06-15 MED ORDER — LACTATED RINGERS IV SOLN: INTRAVENOUS | Status: DC

## 2022-06-15 MED ORDER — TAMSULOSIN HCL 0.4 MG PO CAPS
0.4000 mg | ORAL_CAPSULE | Freq: Every day | ORAL | Status: DC
  Administered 2022-06-15: 0.4 mg via ORAL
  Filled 2022-06-15: qty 1

## 2022-06-15 MED ORDER — CHLORHEXIDINE GLUCONATE CLOTH 2 % EX PADS
6.0000 | MEDICATED_PAD | Freq: Every day | CUTANEOUS | Status: DC
  Administered 2022-06-15 – 2022-06-16 (×2): 6 via TOPICAL

## 2022-06-15 MED ORDER — SODIUM CHLORIDE 0.9 % IV SOLN
1.0000 g | INTRAVENOUS | Status: DC
  Administered 2022-06-15: 1 g via INTRAVENOUS
  Filled 2022-06-15: qty 10

## 2022-06-15 MED ORDER — POTASSIUM CHLORIDE CRYS ER 20 MEQ PO TBCR
40.0000 meq | EXTENDED_RELEASE_TABLET | Freq: Once | ORAL | Status: AC
  Administered 2022-06-15: 40 meq via ORAL
  Filled 2022-06-15: qty 2

## 2022-06-15 MED ORDER — ONDANSETRON HCL 4 MG PO TABS: 4.0000 mg | ORAL_TABLET | Freq: Four times a day (QID) | ORAL | Status: DC | PRN

## 2022-06-15 NOTE — Plan of Care (Signed)
  Problem: Clinical Measurements: Goal: Will remain free from infection Outcome: Adequate for Discharge Goal: Respiratory complications will improve Outcome: Adequate for Discharge Goal: Cardiovascular complication will be avoided Outcome: Adequate for Discharge   Problem: Activity: Goal: Risk for activity intolerance will decrease Outcome: Adequate for Discharge   Problem: Nutrition: Goal: Adequate nutrition will be maintained Outcome: Adequate for Discharge   Problem: Coping: Goal: Level of anxiety will decrease Outcome: Adequate for Discharge   Problem: Education: Goal: Knowledge of General Education information will improve Description: Including pain rating scale, medication(s)/side effects and non-pharmacologic comfort measures Outcome: Completed/Met

## 2022-06-15 NOTE — Progress Notes (Signed)
Urology Progress Note    Subjective: No acute events overnight. RN staff irrigated catheter shortly before rounds. Urine cherry red on slow gtt. CBI increased. Hgb 13.2 (14.9), Cr 0.6 (0.74).  Objective: Vital signs in last 24 hours: Temp:  [97.6 F (36.4 C)-97.8 F (36.6 C)] 97.6 F (36.4 C) (04/04 0536) Pulse Rate:  [55-88] 65 (04/04 0536) Resp:  [16-18] 16 (04/04 0536) BP: (120-144)/(78-129) 131/78 (04/04 0536) SpO2:  [98 %-100 %] 99 % (04/04 0536) Weight:  [86.2 kg] 86.2 kg (04/03 1554)  Intake/Output from previous day: 04/03 0701 - 04/04 0700 In: 455.3 [I.V.:405.3; IV Piggyback:50] Out: [id number] Intake/Output this shift: No intake/output data recorded.  Physical Exam:  General: Alert and oriented CV: Regular rate Lungs: Normal work of breathing on room air Abdomen: Soft, nontender GU: Foley in place draining cherry red urine, CBI on moderate gtt  Ext: NT, No erythema  Lab Results: Recent Labs    06/14/22 1930 06/15/22 0518  HGB 14.9 13.2  HCT 46.4 38.8*   BMET Recent Labs    06/14/22 1930 06/15/22 0518  NA 138 137  K 3.5 3.3*  CL 109 108  CO2 19* 21*  GLUCOSE 76 101*  BUN 29* 24*  CREATININE 0.74 0.60*  CALCIUM 8.6* 8.3*     Studies/Results: CT ABDOMEN PELVIS W CONTRAST  Result Date: 06/14/2022 CLINICAL DATA:  Pilar Plate hematuria EXAM: CT ABDOMEN AND PELVIS WITH CONTRAST TECHNIQUE: Multidetector CT imaging of the abdomen and pelvis was performed using the standard protocol following bolus administration of intravenous contrast. RADIATION DOSE REDUCTION: This exam was performed according to the departmental dose-optimization program which includes automated exposure control, adjustment of the mA and/or kV according to patient size and/or use of iterative reconstruction technique. CONTRAST:  161mL OMNIPAQUE IOHEXOL 300 MG/ML  SOLN COMPARISON:  None Available. FINDINGS: Lower chest: No acute abnormality.  Coronary artery calcification.  Hepatobiliary: Hepatic cysts. Unremarkable gallbladder and biliary tree. Pancreas: Fatty atrophy.  No acute abnormality. Spleen: Unremarkable. Adrenals/Urinary Tract: Normal adrenal glands. Nonobstructing 5 mm stone in the upper pole of the right kidney. No obstructing urinary calculi or hydronephrosis. Foley catheter within the bladder. There are a few locules of gas within the anterior bladder wall possibly related to Foley catheter placement. Hyperdense fluid is present within the bladder about the Foley catheter compatible with hemorrhage/clot. Stomach/Bowel: Normal caliber large and small bowel. Colonic diverticulosis without diverticulitis. Stomach is within normal limits. Vascular/Lymphatic: Aortic atherosclerosis. No enlarged abdominal or pelvic lymph nodes. Reproductive: Enlarged prostate. Other: No free intraperitoneal fluid or air. Musculoskeletal: Superior endplate compression deformity of T12 is presumed chronic. Thoracolumbar spondylosis. IMPRESSION: 1. Hyperdense fluid within the bladder about the Foley catheter compatible with hemorrhage/clot. 2. Nonobstructing 5 mm stone in the upper pole of the right kidney. No obstructing urinary calculi or hydronephrosis. 3. Enlarged prostate. 4. Extensive colonic diverticulosis without evidence of acute diverticulitis. Coronary artery and aortic Atherosclerosis (ICD10-I70.0). Electronically Signed   By: Placido Sou M.D.   On: 06/14/2022 20:39    Assessment/Plan: BPH with symptomatic LUTS Gross hematuria Nephrolithiasis  - Continue foley catheter to gravity drainage with CBI titrated to light pink. - OK for RN staff to flush catheter PRN - Please start patient on tamsulosin 0.4mg  daily and Finasteride 5mg  daily for BPH. - s/p empiric Ceftriaxone. Urine culture pending, will follow up results. - Will need outpatient cystoscopy to complete hematuria workup. - No intervention currently indicated for the nonobstructing right renal stone. Will continue  to monitor - Has follow  up scheduled on 06/19/22 with Dr. Junious Silk. Will keep that appointment for now, pending hospital stay.   Urology will continue to follow. Please page urology pager with any questions or concerns.   LOS: 0 days   Theta Leaf Shaunna Rosetti 06/15/2022, 7:39 AM

## 2022-06-15 NOTE — Progress Notes (Signed)
[facility]  PROGRESS NOTE  Frederick Glover [id number] DOB: Apr 25, 1940 DOA: 06/14/2022 PCP: Johna Roles, PA   Brief HPI:   82 year old male with history of atrial fibrillation not on anticoagulation, BPH, bilateral hearing loss, history of allergic rhinitis who was sent from urologist office with concern for gross hematuria.  Patient had similar episode a month ago when he had UTI.  At that time he was treated with Cipro which resolved.  Patient started having painless hematuria.  He went to urologist office where he had bladder irrigation but bleeding did not stop.  He was seen in the ER and continued to have significant hematuria.  Continuous irrigation started after discussion with urology.    Subjective   Patient seen and examined, Foley catheter in place.  Continues to have hematuria.  Urology has seen the patient.   Assessment/Plan:     Gross hematuria -Unclear etiology -Continuous irrigation started.  Continue Rocephin empirically. -Follow urine culture results -Hold aspirin, patient not on anticoagulation -Hemoglobin is 13.2 this morning -Likely will need cystoscopy, as per urology  BPH -Started on tamsulosin, Proscar  Atrial fibrillation -Heart rate is controlled, patient not on anticoagulation  Hypokalemia -Replace potassium and follow BMP in am  Hypertension -Blood pressure has been elevated -Patient is not on any antihypertensive medications -Monitor blood pressure and treat as needed  Allergic rhinitis -Continue claritin as needed  Medications     loratadine  10 mg Oral Daily     Data Reviewed:   CBG:  No results for input(s): "GLUCAP" in the last 168 hours.  SpO2: 99 %    Vitals:   06/14/22 2130 06/15/22 0000 06/15/22 0536 06/15/22 0816  BP: (!) 142/129 (!) 143/86 131/78 131/79  Pulse: 64 (!) 55 65 (!) 51  Resp: 17 18 16 18   Temp:  97.7 F (36.5 C) 97.6 F (36.4 C) 97.7 F (36.5 C)  TempSrc:  Oral Oral Oral  SpO2: 98%  100% 99% 99%  Weight:      Height:          Data Reviewed:  Basic Metabolic Panel: Recent Labs  Lab 06/14/22 1930 06/15/22 0518  NA 138 137  K 3.5 3.3*  CL 109 108  CO2 19* 21*  GLUCOSE 76 101*  BUN 29* 24*  CREATININE 0.74 0.60*  CALCIUM 8.6* 8.3*    CBC: Recent Labs  Lab 06/14/22 1930 06/15/22 0518  WBC 7.8 7.4  NEUTROABS 5.2  --   HGB 14.9 13.2  HCT 46.4 38.8*  MCV 95.9 88.8  PLT 154 151    LFT Recent Labs  Lab 06/15/22 0518  AST 22  ALT 19  ALKPHOS 44  BILITOT 1.0  PROT 6.2*  ALBUMIN 3.6     Antibiotics: Anti-infectives (From admission, onward)    Start     Dose/Rate Route Frequency Ordered Stop   06/15/22 2000  cefTRIAXone (ROCEPHIN) 1 g in sodium chloride 0.9 % 100 mL IVPB        1 g 200 mL/hr over 30 Minutes Intravenous Every 24 hours 06/15/22 0108     06/14/22 2100  cefTRIAXone (ROCEPHIN) 1 g in sodium chloride 0.9 % 100 mL IVPB        1 g 200 mL/hr over 30 Minutes Intravenous  Once 06/14/22 2048 06/14/22 2153        DVT prophylaxis: SCDs  Code Status: Full code  Family Communication: Discussed with patient's wife at bedside   CONSULTS urology   Objective  Physical Examination:  General-appears in no acute distress Heart-S1-S2, regular, no murmur auscultated Lungs-clear to auscultation bilaterally, no wheezing or crackles auscultated Abdomen-soft, nontender, no organomegaly Extremities-no edema in the lower extremities Neuro-alert, oriented x3, no focal deficit noted  Status is: Inpatient: Hematuria           Jette   [facility] If 7PM-7AM, please contact night-coverage at [email or web address], Office  [phone]   06/15/2022, 9:14 AM  LOS: 0 days

## 2022-06-15 NOTE — Progress Notes (Signed)
When I rounded earlier, nurse and patient said the catheter was intermittently clogging and they were having issues irrigating it and running the CBI.  Swung back by this evening to check and his urine still cherry red.  It is hard to know if he still bleeding or if these are old clots.  I discussed with the patient the nature risk benefits and alternatives to a catheter change and irrigation and he elected to proceed.  Procedure: The existing three-way Foley catheter balloon was deflated and catheter was removed without difficulty.  He was prepped and draped in the usual sterile fashion and a new 20 [place] hematuria catheter was placed.  I put about 15 cc in the balloon.  It was seated at the bladder neck.  I then irrigated him with a [place] syringe.  I irrigated multiple clots until he was clear.  He did have some bladder spasms.  Equal return.  Catheter reconnected to CBI and running at a steady drip and clear.  He has wife, daughter and son were on the way out before we change the catheter.  We discussed again the plan for tamsulosin, finasteride and bicalutamide.  If his urine clears he can go home with the Foley, remain on medicines and follow-up for a voiding trial.  Then we can regroup in the outpatient with cystoscopy and plan an elective BPH procedure if he wishes.  If he continues to bleed he may need again to go tomorrow for cystoscopy possible TURP possible TURBT.  We discussed that type of procedure is typically more of a damage control and to fulgurate any bleeding.  All questions answered.

## 2022-06-15 NOTE — Progress Notes (Signed)
Patient  complained about pain and leakage around foley.No urine in catheter tubing.Hand Irrigated foley with normal saline. Moderate  amount of Bloody clots/sludge obtained. Urine now free flowing with CBI. Darylene Price

## 2022-06-15 NOTE — Progress Notes (Signed)
  Transition of Care [facility]) Screening Note   Patient Details  Name: MICHIAL BREHENY Date of Birth: Oct 23, 1940   Transition of Care [facility]) CM/SW Contact:    Dessa Phi, RN Phone Number: 06/15/2022, 11:13 AM    Transition of Care Department [facility]) has reviewed patient and no TOC needs have been identified at this time. We will continue to monitor patient advancement through interdisciplinary progression rounds. If new patient transition needs arise, please place a TOC consult.

## 2022-06-16 DIAGNOSIS — I480 Paroxysmal atrial fibrillation: Secondary | ICD-10-CM | POA: Diagnosis not present

## 2022-06-16 DIAGNOSIS — R310 Gross hematuria: Secondary | ICD-10-CM | POA: Diagnosis not present

## 2022-06-16 LAB — BASIC METABOLIC PANEL
Anion gap: 6 (ref 5–15)
BUN: 23 mg/dL (ref 8–23)
CO2: 22 mmol/L (ref 22–32)
Calcium: 8.4 mg/dL — ABNORMAL LOW (ref 8.9–10.3)
Chloride: 110 mmol/L (ref 98–111)
Creatinine, Ser: 0.79 mg/dL (ref 0.61–1.24)
GFR, Estimated: >60 mL/min (ref >60)
Glucose, Bld: 116 mg/dL — ABNORMAL HIGH (ref 70–99)
Potassium: 4 mmol/L (ref 3.5–5.1)
Sodium: 138 mmol/L (ref 135–145)

## 2022-06-16 LAB — CBC
HCT: 36.2 % — ABNORMAL LOW (ref 39.0–52.0)
Hemoglobin: 12.5 g/dL — ABNORMAL LOW (ref 13.0–17.0)
MCH: 30.4 pg (ref 26.0–34.0)
MCHC: 34.5 g/dL (ref 30.0–36.0)
MCV: 88.1 fL (ref 80.0–100.0)
Platelets: 141 10*3/uL — ABNORMAL LOW (ref 150–400)
RBC: 4.11 MIL/uL — ABNORMAL LOW (ref 4.22–5.81)
RDW: 14.4 % (ref 11.5–15.5)
WBC: 6.7 10*3/uL (ref 4.0–10.5)
nRBC: 0 % (ref 0.0–0.2)

## 2022-06-16 MED ORDER — TAMSULOSIN HCL 0.4 MG PO CAPS: 0.4000 mg | ORAL_CAPSULE | Freq: Every day | ORAL | 0 refills | Status: AC

## 2022-06-16 MED ORDER — FINASTERIDE 5 MG PO TABS: 5.0000 mg | ORAL_TABLET | Freq: Every day | ORAL | 0 refills | Status: AC

## 2022-06-16 MED ORDER — LORATADINE 10 MG PO TABS: 10.0000 mg | ORAL_TABLET | Freq: Every day | ORAL | Status: DC | PRN

## 2022-06-16 MED ORDER — MENTHOL 3 MG MT LOZG: 1.0000 | LOZENGE | OROMUCOSAL | Status: DC | PRN

## 2022-06-16 MED ORDER — CEPHALEXIN 500 MG PO CAPS: 500.0000 mg | ORAL_CAPSULE | Freq: Every day | ORAL | 0 refills | Status: AC

## 2022-06-16 NOTE — Discharge Summary (Signed)
Physician Discharge Summary   Patient: Franne GripStuart T Keithly MRN: [id number] DOB: 01/19/1941  Admit date:     06/14/2022  Discharge date: 06/16/22  Discharge Physician: Meredeth IdeGagan S Trenity Pha   PCP: Delma Officerlelland, Olivia M, PA   Recommendations at discharge:   Follow-up urology as outpatient  Discharge Diagnoses: Principal Problem:   Gross hematuria Active Problems:   Atrial fibrillation   Elevated blood pressure reading without diagnosis of hypertension   Hematuria  Resolved Problems:   * No resolved hospital problems. *  Hospital Course:  82 year old male with history of atrial fibrillation not on anticoagulation, BPH, bilateral hearing loss, history of allergic rhinitis who was sent from urologist office with concern for gross hematuria.  Patient had similar episode a month ago when he had UTI.  At that time he was treated with Cipro which resolved.  Patient started having painless hematuria.  He went to urologist office where he had bladder irrigation but bleeding did not stop.  He was seen in the ER and continued to have significant hematuria.  Continuous irrigation started after discussion with urology.    Assessment and Plan:  Gross hematuria -Unclear etiology -Continuous irrigation started.  He was started on Rocephin empirically -Urine culture is negative -Hold aspirin, patient not on anticoagulation -Hemoglobin is 13.2 this morning -Hematuria has improved, urology recommends to discharge with Foley catheter. -They will do cystoscopy as outpatient. -Will discharge on cephalexin 500 mg p.o. nightly for 14 days   BPH -Started on tamsulosin, Proscar    Atrial fibrillation -Heart rate is controlled, patient not on anticoagulation -Will hold aspirin at this time due to ongoing hematuria   Hypokalemia -Replete   Hypertension -Blood pressure has been stable during hospital stay   Allergic rhinitis -Continue claritin as needed        Consultants: Urology Procedures performed:    Disposition: Home Diet recommendation:  Discharge Diet Orders (From admission, onward)     Start     Ordered   06/16/22 0000  Diet - low sodium heart healthy        06/16/22 1104           Regular diet DISCHARGE MEDICATION: Allergies as of 06/16/2022   No Known Allergies      Medication List     STOP taking these medications    aspirin 81 MG tablet   metroNIDAZOLE 0.75 % cream Commonly known as: METROCREAM   mometasone 50 MCG/ACT nasal spray Commonly known as: NASONEX   sulfamethoxazole-trimethoprim 800-160 MG tablet Commonly known as: BACTRIM DS       TAKE these medications    acyclovir 400 MG tablet Commonly known as: ZOVIRAX 2 tablets twice a day for 5 days as needed for flare of fever blister What changed:  how much to take how to take this when to take this reasons to take this additional instructions   cephALEXin 500 MG capsule Commonly known as: KEFLEX Take 1 capsule (500 mg total) by mouth at bedtime for 14 days.   fexofenadine 180 MG tablet Commonly known as: ALLEGRA Take 180 mg by mouth daily. allergies PRN   finasteride 5 MG tablet Commonly known as: PROSCAR Take 1 tablet (5 mg total) by mouth daily. Start taking on: June 17, 2022   Flonase Allergy Relief 50 MCG/ACT nasal spray Generic drug: fluticasone 1 spray in each nostril Nasally once a day if needed   fluoruracil 0.5 % cream Commonly known as: CARAC Apply to skin twice daily for 2 weeks as needed  for pre-cancerous skin lesions   Glucosamine-Chondroitin Caps Take 2 capsules by mouth daily.   Multivital tablet Take 1 tablet by mouth daily.   tamsulosin 0.4 MG Caps capsule Commonly known as: FLOMAX Take 1 capsule (0.4 mg total) by mouth daily after supper.   Tavaborole 5 % Soln Commonly known as: Kerydin Apply 1 drop topically daily. Apply 1 drop to the toenail daily.        Follow-up Information     Jerilee Field, MD Follow up on 06/19/2022.   Specialty:  Urology Why: at 2:45 pm Contact information: [address] [place] [address] [phone]                Discharge Exam: Ceasar Mons Weights   06/14/22 1554  Weight: 86.2 kg   General-appears in no acute distress Heart-S1-S2, regular, no murmur auscultated Lungs-clear to auscultation bilaterally, no wheezing or crackles auscultated Abdomen-soft, nontender, no organomegaly Extremities-no edema in the lower extremities Neuro-alert, oriented x3, no focal deficit noted  Condition at discharge: good  The results of significant diagnostics from this hospitalization (including imaging, microbiology, ancillary and laboratory) are listed below for reference.   Imaging Studies: CT ABDOMEN PELVIS W CONTRAST  Result Date: 06/14/2022 CLINICAL DATA:  Homero Fellers hematuria EXAM: CT ABDOMEN AND PELVIS WITH CONTRAST TECHNIQUE: Multidetector CT imaging of the abdomen and pelvis was performed using the standard protocol following bolus administration of intravenous contrast. RADIATION DOSE REDUCTION: This exam was performed according to the departmental dose-optimization program which includes automated exposure control, adjustment of the mA and/or kV according to patient size and/or use of iterative reconstruction technique. CONTRAST:  OMNIPAQUE IOHEXOL 300 MG/ML  SOLN COMPARISON:  None Available. FINDINGS: Lower chest: No acute abnormality.  Coronary artery calcification. Hepatobiliary: Hepatic cysts. Unremarkable gallbladder and biliary tree. Pancreas: Fatty atrophy.  No acute abnormality. Spleen: Unremarkable. Adrenals/Urinary Tract: Normal adrenal glands. Nonobstructing 5 mm stone in the upper pole of the right kidney. No obstructing urinary calculi or hydronephrosis. Foley catheter within the bladder. There are a few locules of gas within the anterior bladder wall possibly related to Foley catheter placement. Hyperdense fluid is present within the bladder about the Foley catheter compatible with  hemorrhage/clot. Stomach/Bowel: Normal caliber large and small bowel. Colonic diverticulosis without diverticulitis. Stomach is within normal limits. Vascular/Lymphatic: Aortic atherosclerosis. No enlarged abdominal or pelvic lymph nodes. Reproductive: Enlarged prostate. Other: No free intraperitoneal fluid or air. Musculoskeletal: Superior endplate compression deformity of T12 is presumed chronic. Thoracolumbar spondylosis. IMPRESSION: 1. Hyperdense fluid within the bladder about the Foley catheter compatible with hemorrhage/clot. 2. Nonobstructing 5 mm stone in the upper pole of the right kidney. No obstructing urinary calculi or hydronephrosis. 3. Enlarged prostate. 4. Extensive colonic diverticulosis without evidence of acute diverticulitis. Coronary artery and aortic Atherosclerosis (ICD10-I70.0). Electronically Signed   By: Minerva Fester M.D.   On: 06/14/2022 20:39    Microbiology: Results for orders placed or performed during the hospital encounter of 06/14/22  Urine Culture     Status: None   Collection Time: 06/14/22  6:09 PM   Specimen: Urine, Catheterized  Result Value Ref Range Status   Specimen Description   Final    URINE, CATHETERIZED Performed at [facility], [address], [place], [place] [address]    Special Requests   Final    NONE Performed at [facility], [address], [place], [place] [address]    Culture   Final    NO GROWTH Performed at [facility]  [facility], [address] [place], [place] [id number]    Report Status 06/15/2022 FINAL  Final    Labs: CBC: Recent Labs  Lab 06/14/22 1930 06/15/22 0518 06/16/22 0510  WBC 7.8 7.4 6.7  NEUTROABS 5.2  --   --   HGB 14.9 13.2 12.5*  HCT 46.4 38.8* 36.2*  MCV 95.9 88.8 88.1  PLT 154 151 141*   Basic Metabolic Panel: Recent Labs  Lab 06/14/22 1930 06/15/22 0518 06/16/22 0510  NA 138 137 138  K 3.5 3.3* 4.0  CL 109 108 110  CO2 19* 21* 22  GLUCOSE 76 101* 116*   BUN 29* 24* 23  CREATININE 0.74 0.60* 0.79  CALCIUM 8.6* 8.3* 8.4*   Liver Function Tests: Recent Labs  Lab 06/15/22 0518  AST 22  ALT 19  ALKPHOS 44  BILITOT 1.0  PROT 6.2*  ALBUMIN 3.6   CBG: No results for input(s): "GLUCAP" in the last 168 hours.  Discharge time spent: greater than 30 minutes.  Signed: Meredeth IdeGagan S Mia Milan, MD [facility] 06/16/2022

## 2022-06-16 NOTE — Progress Notes (Addendum)
Urology Progress Note    Subjective: No acute events overnight. Minimal issues with catheter overnight. Urine clear on moderate gtt CBI. CBI clamped this morning on rounds. S/p casodex x 1 yesterday and finasteride. Urine culture no growth  Objective: Vital signs in last 24 hours: Temp:  [97.7 F (36.5 C)-98.2 F (36.8 C)] 98 F (36.7 C) (04/05 0501) Pulse Rate:  [51-68] 61 (04/05 0501) Resp:  [16-18] 16 (04/05 0501) BP: (108-138)/(59-86) 115/59 (04/05 0501) SpO2:  [94 %-99 %] 96 % (04/05 0501)  Intake/Output from previous day: 04/04 0701 - 04/05 0700 In: 24326.6 [P.O.:1570; I.V.:2106.6; IV Piggyback:100] Out: [id number] [Urine:21325] Intake/Output this shift: No intake/output data recorded.  Physical Exam:  General: Alert and oriented CV: Regular rate Lungs: Normal work of breathing on room air Abdomen: Soft, nontender GU: Foley in place draining clear, CBI in place Ext: NT, No erythema  Lab Results: Recent Labs    06/14/22 1930 06/15/22 0518 06/16/22 0510  HGB 14.9 13.2 12.5*  HCT 46.4 38.8* 36.2*    BMET Recent Labs    06/15/22 0518 06/16/22 0510  NA 137 138  K 3.3* 4.0  CL 108 110  CO2 21* 22  GLUCOSE 101* 116*  BUN 24* 23  CREATININE 0.60* 0.79  CALCIUM 8.3* 8.4*      Studies/Results: CT ABDOMEN PELVIS W CONTRAST  Result Date: 06/14/2022 CLINICAL DATA:  Homero FellersFrank hematuria EXAM: CT ABDOMEN AND PELVIS WITH CONTRAST TECHNIQUE: Multidetector CT imaging of the abdomen and pelvis was performed using the standard protocol following bolus administration of intravenous contrast. RADIATION DOSE REDUCTION: This exam was performed according to the departmental dose-optimization program which includes automated exposure control, adjustment of the mA and/or kV according to patient size and/or use of iterative reconstruction technique. CONTRAST:  100mL OMNIPAQUE IOHEXOL 300 MG/ML  SOLN COMPARISON:  None Available. FINDINGS: Lower chest: No acute abnormality.  Coronary  artery calcification. Hepatobiliary: Hepatic cysts. Unremarkable gallbladder and biliary tree. Pancreas: Fatty atrophy.  No acute abnormality. Spleen: Unremarkable. Adrenals/Urinary Tract: Normal adrenal glands. Nonobstructing 5 mm stone in the upper pole of the right kidney. No obstructing urinary calculi or hydronephrosis. Foley catheter within the bladder. There are a few locules of gas within the anterior bladder wall possibly related to Foley catheter placement. Hyperdense fluid is present within the bladder about the Foley catheter compatible with hemorrhage/clot. Stomach/Bowel: Normal caliber large and small bowel. Colonic diverticulosis without diverticulitis. Stomach is within normal limits. Vascular/Lymphatic: Aortic atherosclerosis. No enlarged abdominal or pelvic lymph nodes. Reproductive: Enlarged prostate. Other: No free intraperitoneal fluid or air. Musculoskeletal: Superior endplate compression deformity of T12 is presumed chronic. Thoracolumbar spondylosis. IMPRESSION: 1. Hyperdense fluid within the bladder about the Foley catheter compatible with hemorrhage/clot. 2. Nonobstructing 5 mm stone in the upper pole of the right kidney. No obstructing urinary calculi or hydronephrosis. 3. Enlarged prostate. 4. Extensive colonic diverticulosis without evidence of acute diverticulitis. Coronary artery and aortic Atherosclerosis (ICD10-I70.0). Electronically Signed   By: Minerva Festeryler  Stutzman M.D.   On: 06/14/2022 20:39    Assessment/Plan: BPH with symptomatic LUTS Gross hematuria Nephrolithiasis  - Continue foley catheter to gravity drainage. CBI clamped this AM. If urine remains clear, may be ok for discharge later today. - OK for diet at this time. No plan for OR today. - OK for RN staff to flush catheter PRN -  Continue tamsulosin, finasteride  - Send home on a nightly antibiotic such as cephalexin, Bactrim or nitrofurantoin x 14 days - s/p empiric Ceftriaxone. Urine culture No growth -  Will  follow up with [facility] for hematuria workup and outpatient TOV.   Urology will continue to follow. Please page urology pager with any questions or concerns.  Attending attestation: Patient seen and examined on rounds.  Urine clear on CBI drip, grade 2 hematuria off.  He can eat this morning.  Plan to discharge home later today with Foley catheter to gravity.  I will arrange a voiding trial in the office.  Again I went over with the patient and family the nature risk and benefits of tamsulosin and finasteride.  Will have him see him back in the office for voiding trial.  I will then see him back for cystoscopy.  This will be to evaluate for BPH and for the gross hematuria to ensure no bladder cancer etc.  In the long run we discussed prostate procedures depending on his prostate size and anatomy. Certainly, one advantage of procedures is he might be able to get off medicines.  In the short-term the medicines should be able to help him void with a better stream and less risk of UTI and bleeding.  I discussed the patient with Dr. Deno Etienne and agree with her assessment and plan.   LOS: 1 day   Frederick Glover 06/16/2022, 7:23 AM

## 2022-06-16 NOTE — Progress Notes (Signed)
Mobility Specialist - Progress Note   06/16/22 1001  Mobility  Activity Ambulated with assistance in hallway  Level of Assistance Standby assist, set-up cues, supervision of patient - no hands on  Assistive Device Other (Comment) (IV Pole)  Distance Ambulated (ft) 500 ft  Activity Response Tolerated well  Mobility Referral Yes  $Mobility charge 1 Mobility   Pt received in recliner and agreeable to mobility. No complaints during session. Pt to recliner after session with all needs met.    [employer]

## 2022-06-17 ENCOUNTER — Other Ambulatory Visit: Payer: Self-pay

## 2022-06-17 ENCOUNTER — Encounter (HOSPITAL_COMMUNITY): Payer: Self-pay

## 2022-06-17 ENCOUNTER — Emergency Department (HOSPITAL_COMMUNITY)
Admission: EM | Admit: 2022-06-17 | Discharge: 2022-06-17 | Disposition: A | Discharge status: Home / Self Care | Payer: Medicare PPO | Attending: Emergency Medicine | Admitting: Emergency Medicine

## 2022-06-17 DIAGNOSIS — R109 Unspecified abdominal pain: Secondary | ICD-10-CM | POA: Diagnosis present

## 2022-06-17 DIAGNOSIS — N320 Bladder-neck obstruction: Secondary | ICD-10-CM | POA: Diagnosis not present

## 2022-06-17 LAB — CBC
HCT: 38.3 % — ABNORMAL LOW (ref 39.0–52.0)
Hemoglobin: 13.5 g/dL (ref 13.0–17.0)
MCH: 30.8 pg (ref 26.0–34.0)
MCHC: 35.2 g/dL (ref 30.0–36.0)
MCV: 87.4 fL (ref 80.0–100.0)
Platelets: 179 10*3/uL (ref 150–400)
RBC: 4.38 MIL/uL (ref 4.22–5.81)
RDW: 14.1 % (ref 11.5–15.5)
WBC: 8.5 10*3/uL (ref 4.0–10.5)
nRBC: 0 % (ref 0.0–0.2)

## 2022-06-17 LAB — BASIC METABOLIC PANEL
Anion gap: 8 (ref 5–15)
BUN: 17 mg/dL (ref 8–23)
CO2: 21 mmol/L — ABNORMAL LOW (ref 22–32)
Calcium: 9 mg/dL (ref 8.9–10.3)
Chloride: 109 mmol/L (ref 98–111)
Creatinine, Ser: 0.68 mg/dL (ref 0.61–1.24)
GFR, Estimated: >60 mL/min (ref >60)
Glucose, Bld: 119 mg/dL — ABNORMAL HIGH (ref 70–99)
Potassium: 3.7 mmol/L (ref 3.5–5.1)
Sodium: 138 mmol/L (ref 135–145)

## 2022-06-17 NOTE — ED Triage Notes (Signed)
Patient is here for evaluation of lower abdomen pain. Reports he had a catheter placed for hematuria and is having is having some lower abdominal cramping now.

## 2022-06-17 NOTE — ED Notes (Signed)
Irrigated patient's foley manually. There were no blood clots irrigated out of the foley, only a light pinkish color was irrigated out of foley. Pt's foley flushed easily with no pain to the patient. Informed PA of results.

## 2022-06-17 NOTE — Discharge Instructions (Signed)
Get help right away if: You develop severe vomiting and are unable to take medicine without vomiting. You develop severe pain in your back or abdomen even though you are taking medicine. You pass a large amount of blood in your urine. You pass blood clots in your urine. You feel very weak or like you might faint. You faint. 

## 2022-06-17 NOTE — ED Notes (Signed)
Bladder scanned pt, pt has 25 - 63 mL of urine retained in bladder.

## 2022-06-17 NOTE — ED Provider Notes (Signed)
[place] EMERGENCY DEPARTMENT AT [facility] Provider Note   CSN: [id number] Arrival date & time: 06/17/22  1246     History  Chief Complaint  Patient presents with   Abdominal Pain    Frederick Glover is a 82 y.o. male who presents emergency department with complaint of bladder outlet obstruction.  Patient was discharged yesterday after being hospitalized for bladder irrigation due to gross hematuria and has a three-way catheter in place with leg bag.  He states he was fine this morning however midmorning he started having intense pressure in his bladder with severe pain and bladder spasms and was urinating around his catheter without any urine flowing into the Foley.  This lasted for approximately an hour and patient was on his way to the emergency department and just upon arrival he passed several clots and had relief of all of his pain.  Bedside bladder scan shows only 60 mL in the bladder currently.  He rates his pain at 1-2 out of 10 and is feeling greatly improved   Abdominal Pain      Home Medications Prior to Admission medications   Medication Sig Start Date End Date Taking? Authorizing Provider  acyclovir (ZOVIRAX) 400 MG tablet 2 tablets twice a day for 5 days as needed for flare of fever blister Patient taking differently: Take 800 mg by mouth 2 (two) times daily as needed (for 5 days during a flair up). 01/31/13   Corky CraftsVaranasi, Jayadeep S, MD  cephALEXin (KEFLEX) 500 MG capsule Take 1 capsule (500 mg total) by mouth at bedtime for 14 days. 06/16/22 06/30/22  Meredeth IdeLama, Gagan S, MD  fexofenadine (ALLEGRA) 180 MG tablet Take 180 mg by mouth daily. allergies PRN Patient not taking: Reported on 06/15/2022    [provider]  finasteride (PROSCAR) 5 MG tablet Take 1 tablet (5 mg total) by mouth daily. 06/17/22   Meredeth IdeLama, Gagan S, MD  fluoruracil [facility](CARAC) 0.5 % cream Apply to skin twice daily for 2 weeks as needed for pre-cancerous skin lesions Patient not taking: Reported on  06/15/2022 01/31/13   Corky CraftsVaranasi, Jayadeep S, MD  fluticasone [facility](FLONASE ALLERGY RELIEF) 50 MCG/ACT nasal spray 1 spray in each nostril Nasally once a day if needed Patient not taking: Reported on 06/15/2022    [provider]  Glucosamine-Chondroit-Vit C-Mn (GLUCOSAMINE-CHONDROITIN) CAPS Take 2 capsules by mouth daily.     [provider]  Multiple Vitamins-Minerals (MULTIVITAL) tablet Take 1 tablet by mouth daily.      [provider]  tamsulosin (FLOMAX) 0.4 MG CAPS capsule Take 1 capsule (0.4 mg total) by mouth daily after supper. 06/16/22   Meredeth IdeLama, Gagan S, MD  Tavaborole (KERYDIN) 5 % SOLN Apply 1 drop topically daily. Apply 1 drop to the toenail daily. 03/14/22   Vivi BarrackWagoner, Matthew R, DPM      Allergies    Patient has no known allergies.    Review of Systems   Review of Systems  Gastrointestinal:  Positive for abdominal pain.    Physical Exam Updated Vital Signs BP (!) 147/98 (BP Location: Right Arm)   Pulse 70   Temp 97.9 F (36.6 C) (Oral)   Resp 18   Ht 5\' 10"  (1.778 m)   Wt 86.2 kg   SpO2 99%   BMI 27.27 kg/m  Physical Exam Vitals and nursing note reviewed.  Constitutional:      General: He is not in acute distress.    Appearance: He is well-developed. He is not diaphoretic.  HENT:     Head: Normocephalic and atraumatic.  Eyes:     General: No scleral icterus.    Conjunctiva/sclera: Conjunctivae normal.  Cardiovascular:     Rate and Rhythm: Normal rate and regular rhythm.     Heart sounds: Normal heart sounds.  Pulmonary:     Effort: Pulmonary effort is normal. No respiratory distress.     Breath sounds: Normal breath sounds.  Abdominal:     Palpations: Abdomen is soft.     Tenderness: There is abdominal tenderness in the suprapubic area. There is no guarding.  Genitourinary:    Comments: Foley catheter in place.  There is blood-tinged urine in the bladder and a few small clots.  Urine appears to be flowing normally Musculoskeletal:      Cervical back: Normal range of motion and neck supple.  Skin:    General: Skin is warm and dry.  Neurological:     Mental Status: He is alert.  Psychiatric:        Behavior: Behavior normal.     ED Results / Procedures / Treatments   Labs (all labs ordered are listed, but only abnormal results are displayed) Labs Reviewed  CBC - Abnormal; Notable for the following components:      Result Value   HCT 38.3 (*)    All other components within normal limits  BASIC METABOLIC PANEL - Abnormal; Notable for the following components:   CO2 21 (*)    Glucose, Bld 119 (*)    All other components within normal limits    EKG None  Radiology No results found.  Procedures Procedures    Medications Ordered in ED Medications - No data to display  ED Course/ Medical Decision Making/ A&P                             Medical Decision Making Patient here for complications of his urinary catheter which was only obstructed but it resolved on its own after clots were passed.  \ Reviewed patient's labs which shows no evidence of drop in patient's hemoglobin or any elevation in patient's creatinine.  Case discussed with Dr. Pete Glatter feels that he is safe for discharge.  Patient is comfortable with this plan.  He was otherwise appropriate for discharge  Amount and/or Complexity of Data Reviewed Labs: ordered.          Final Clinical Impression(s) / ED Diagnoses Final diagnoses:  Bladder outflow obstruction    Rx / DC Orders ED Discharge Orders     None         Arthor Captain, PA-C 06/17/22 Patience Musca, MD 06/20/22 1537

## 2022-06-18 ENCOUNTER — Encounter (HOSPITAL_COMMUNITY): Payer: Self-pay

## 2022-06-18 ENCOUNTER — Other Ambulatory Visit: Payer: Self-pay

## 2022-06-18 ENCOUNTER — Emergency Department (HOSPITAL_COMMUNITY)
Admission: EM | Admit: 2022-06-18 | Discharge: 2022-06-18 | Disposition: A | Discharge status: Home / Self Care | Payer: Medicare PPO | Attending: Emergency Medicine | Admitting: Emergency Medicine

## 2022-06-18 DIAGNOSIS — I4891 Unspecified atrial fibrillation: Secondary | ICD-10-CM | POA: Insufficient documentation

## 2022-06-18 DIAGNOSIS — R310 Gross hematuria: Secondary | ICD-10-CM | POA: Diagnosis not present

## 2022-06-18 DIAGNOSIS — R319 Hematuria, unspecified: Secondary | ICD-10-CM | POA: Diagnosis present

## 2022-06-18 LAB — I-STAT CHEM 8, ED
BUN: 16 mg/dL (ref 8–23)
Calcium, Ion: 1.17 mmol/L (ref 1.15–1.40)
Chloride: 105 mmol/L (ref 98–111)
Creatinine, Ser: 0.7 mg/dL (ref 0.61–1.24)
Glucose, Bld: 112 mg/dL — ABNORMAL HIGH (ref 70–99)
HCT: 39 % (ref 39.0–52.0)
Hemoglobin: 13.3 g/dL (ref 13.0–17.0)
Potassium: 3.7 mmol/L (ref 3.5–5.1)
Sodium: 140 mmol/L (ref 135–145)
TCO2: 22 mmol/L (ref 22–32)

## 2022-06-18 NOTE — ED Notes (Signed)
Kelly H, PA at bedside 

## 2022-06-18 NOTE — ED Notes (Signed)
Pt A&OX4 ambulatory at d/c with independent steady gait. Pt verbalized understanding of d/c instructions and follow up care. 

## 2022-06-18 NOTE — ED Notes (Signed)
This RN irrigated the patient's bladder with 300cc of sterile water. No blood clots seen during irrigation but urine was pink tinged. Foley catheter irrigated without difficulty. Tresa Endo, PA informed.

## 2022-06-18 NOTE — ED Provider Notes (Signed)
[place] EMERGENCY DEPARTMENT AT [facility] Provider Note   CSN: [id number] Arrival date & time: 06/18/22  2001     History  Chief Complaint  Patient presents with   Hematuria    Frederick Glover is a 82 y.o. male.  82 year old male with a history of atrial fibrillation (not on chronic anticoagulation), BPH presents to the emergency department for ongoing hematuria.  He was admitted on 06/14/2022 for hematuria which necessitated CBI.  Discharged on 06/16/2022 with course of Keflex as well as Flomax and Proscar which she has been compliant with.  He has been off of his aspirin since his hospitalization.  Presented yesterday for transient bladder outlet obstruction from recurrent hematuria and clots.  His hemoglobin was stable and Urology consulted; felt that it was appropriate to continue outpatient evaluation. Patient states that he has been trying to remain well-hydrated by drinking plenty of water.  Began to notice recurrent hematuria and clotting around 1500 today which has been persistent.  He presently has no abdominal pain or distention.  Does feel that his Foley catheter output has been low at times, though he currently has a fair amount in his leg bag.  No fevers, vomiting.  The history is provided by the patient. No language interpreter was used.  Hematuria       Home Medications Prior to Admission medications   Medication Sig Start Date End Date Taking? Authorizing Provider  acyclovir (ZOVIRAX) 400 MG tablet 2 tablets twice a day for 5 days as needed for flare of fever blister Patient taking differently: Take 800 mg by mouth 2 (two) times daily as needed (for 5 days during a flair up). 01/31/13   Corky Crafts, MD  cephALEXin (KEFLEX) 500 MG capsule Take 1 capsule (500 mg total) by mouth at bedtime for 14 days. 06/16/22 06/30/22  Meredeth Ide, MD  fexofenadine (ALLEGRA) 180 MG tablet Take 180 mg by mouth daily. allergies PRN Patient not taking: Reported on  06/15/2022    [provider]  finasteride (PROSCAR) 5 MG tablet Take 1 tablet (5 mg total) by mouth daily. 06/17/22   Meredeth Ide, MD  fluoruracil [facility]) 0.5 % cream Apply to skin twice daily for 2 weeks as needed for pre-cancerous skin lesions Patient not taking: Reported on 06/15/2022 01/31/13   Corky Crafts, MD  fluticasone [facility] ALLERGY RELIEF) 50 MCG/ACT nasal spray 1 spray in each nostril Nasally once a day if needed Patient not taking: Reported on 06/15/2022    [provider]  Glucosamine-Chondroit-Vit C-Mn (GLUCOSAMINE-CHONDROITIN) CAPS Take 2 capsules by mouth daily.     [provider]  Multiple Vitamins-Minerals (MULTIVITAL) tablet Take 1 tablet by mouth daily.      [provider]  tamsulosin (FLOMAX) 0.4 MG CAPS capsule Take 1 capsule (0.4 mg total) by mouth daily after supper. 06/16/22   Meredeth Ide, MD  Tavaborole (KERYDIN) 5 % SOLN Apply 1 drop topically daily. Apply 1 drop to the toenail daily. 03/14/22   Vivi Barrack, DPM      Allergies    Patient has no known allergies.    Review of Systems   Review of Systems  Genitourinary:  Positive for hematuria.  Ten systems reviewed and are negative for acute change, except as noted in the HPI.    Physical Exam Updated Vital Signs BP (!) 133/90 (BP Location: Right Arm)   Pulse 96   Temp 98 F (36.7 C) (Oral)   Resp 17  Ht 5\' 10"  (1.778 m)   Wt 86.2 kg   SpO2 98%   BMI 27.26 kg/m   Physical Exam Vitals and nursing note reviewed.  Constitutional:      General: He is not in acute distress.    Appearance: He is well-developed. He is not diaphoretic.     Comments: Nontoxic appearing and in NAD  HENT:     Head: Normocephalic and atraumatic.  Eyes:     General: No scleral icterus.    Conjunctiva/sclera: Conjunctivae normal.  Pulmonary:     Effort: Pulmonary effort is normal. No respiratory distress.     Comments: Respirations even and unlabored Genitourinary:     Comments: 3 way foley catheter with gross hematuria in leg bag; no clots. Appears to be actively draining. No leaking around the catheter site at time of exam. Musculoskeletal:        General: Normal range of motion.     Cervical back: Normal range of motion.  Skin:    General: Skin is warm and dry.     Coloration: Skin is not pale.     Findings: No erythema or rash.  Neurological:     Mental Status: He is alert and oriented to person, place, and time.  Psychiatric:        Behavior: Behavior normal.     ED Results / Procedures / Treatments   Labs (all labs ordered are listed, but only abnormal results are displayed) Labs Reviewed  I-STAT CHEM 8, ED - Abnormal; Notable for the following components:      Result Value   Glucose, Bld 112 (*)    All other components within normal limits    EKG None  Radiology No results found.  Procedures Procedures    Medications Ordered in ED Medications - No data to display  ED Course/ Medical Decision Making/ A&P Clinical Course as of 06/18/22 2237  Sun Jun 18, 2022  2056 I-stat chem 8, [facility]!) Hemoglobin stable, unchanged. Kidney function preserved.  [KH]  2123 21cc on bladder scan. Able to irrigate 300cc with appropriate return. Urine is blood tinged, but no clots passed. [KH]  2144 Spoke with Dr. Benancio DeedsNewsome of Urology and discussed successful bladder irrigation, no passage of clots in the ED, stable Hgb and preserved creatinine. He agrees with continued outpatient follow up. Would have the patient call the office in the morning given ongoing hematuria to ensure prompt visit to their office. [KH]  2236 Patient reassessed.  He continues to have good output from his Foley catheter.  Urine is blood-tinged, but no clots present. [KH]    Clinical Course User Index [KH] Antony MaduraHumes, Montina Dorrance, PA-C                             Medical Decision Making Amount and/or Complexity of Data Reviewed Labs:  Decision-making details  documented in ED Course.   This patient presents to the ED for concern of hematuria, this involves an extensive number of treatment options, and is a complaint that carries with it a high risk of complications and morbidity.  The differential diagnosis includes bladder CA vs UTI vs interstitial cystitis vs BPH   Co morbidities that complicate the patient evaluation  Afib   Additional history obtained:  Additional history obtained from son, at bedside External records from outside source obtained and reviewed including abdominal CT from 06/14/22   Lab Tests:  I Ordered,  and personally interpreted labs.  The pertinent results include:  normal Chem-8   Cardiac Monitoring:  The patient was maintained on a cardiac monitor.  I personally viewed and interpreted the cardiac monitored which showed an underlying rhythm of: NSR   Medicines ordered and prescription drug management:  I have reviewed the patients home medicines and have made adjustments as needed   Test Considered:  Renal US   Consultations Obtained:  I requested consultation with Dr. Benancio Deeds of Urology and discussed lab and imaging findings as well as pertinent plan - they recommend: follow up in the office this week.   Problem List / ED Course:  As above   Reevaluation:  After the interventions noted above, I reevaluated the patient and found that they have :stayed the same   Social Determinants of Health:  Good social support   Dispostion:  After consideration of the diagnostic results and the patients response to treatment, I feel that the patent would benefit from close urologic follow up in the office. Return precautions discussed and provided. Patient discharged in stable condition with no unaddressed concerns.          Final Clinical Impression(s) / ED Diagnoses Final diagnoses:  Gross hematuria    Rx / DC Orders ED Discharge Orders     None         Antony Madura,  Cordelia Poche 06/18/22 2239    Linwood Dibbles, MD 06/20/22 [phone]

## 2022-06-18 NOTE — Discharge Instructions (Signed)
Follow-up with [facility] as soon as you are able for further evaluation of your ongoing issue.

## 2022-06-18 NOTE — ED Triage Notes (Signed)
PER EMS: pt is from home with c/o hematuria and blood clots in his urinary catheter. He was seen here yesterday for the same complaint. He recently had it replaced on 06/15/22. Pt denies any pain.

## 2022-07-10 ENCOUNTER — Other Ambulatory Visit: Payer: Self-pay | Admitting: Podiatry

## 2022-07-11 ENCOUNTER — Telehealth (HOSPITAL_COMMUNITY): Payer: Self-pay | Admitting: *Deleted

## 2022-07-11 NOTE — Telephone Encounter (Signed)
Attempted to call patient regarding upcoming cardiac PET appointment. Left message on voicemail with name and callback number  [name] RN Navigator Cardiac Imaging [facility] [phone] Office [phone] Cell  Reminder to withhold caffeine 12 hours prior to his cardiac PET appointment.

## 2022-07-12 ENCOUNTER — Encounter (HOSPITAL_COMMUNITY)
Admission: RE | Admit: 2022-07-12 | Discharge: 2022-07-12 | Disposition: A | Discharge status: Home / Self Care | Payer: Medicare PPO | Source: Ambulatory Visit | Attending: Interventional Cardiology | Admitting: Interventional Cardiology

## 2022-07-12 DIAGNOSIS — R072 Precordial pain: Secondary | ICD-10-CM

## 2022-07-12 LAB — NM PET CT CARDIAC PERFUSION MULTI W/ABSOLUTE BLOODFLOW
LV dias vol: 100 mL (ref 62–150)
LV sys vol: 48 mL
MBFR: 2.63
Nuc Rest EF: 52 %
Nuc Stress EF: 59 %
Rest MBF: 0.82 ml/g/min
Rest Nuclear Isotope Dose: 22.6 mCi
ST Depression (mm): 0 mm
Stress MBF: 2.16 ml/g/min
Stress Nuclear Isotope Dose: 22.4 mCi

## 2022-07-12 MED ORDER — RUBIDIUM RB82 GENERATOR (RUBYFILL)
22.6000 | PACK | Freq: Once | INTRAVENOUS | Status: AC
  Administered 2022-07-12: 22.6 via INTRAVENOUS

## 2022-07-12 MED ORDER — REGADENOSON 0.4 MG/5ML IV SOLN
0.4000 mg | Freq: Once | INTRAVENOUS | Status: AC
  Administered 2022-07-12: 0.4 mg via INTRAVENOUS

## 2022-07-12 MED ORDER — REGADENOSON 0.4 MG/5ML IV SOLN
INTRAVENOUS | Status: AC
  Filled 2022-07-12: qty 5

## 2022-07-12 MED ORDER — RUBIDIUM RB82 GENERATOR (RUBYFILL)
22.4000 | PACK | Freq: Once | INTRAVENOUS | Status: AC
  Administered 2022-07-12: 22.4 via INTRAVENOUS

## 2022-07-20 ENCOUNTER — Encounter: Payer: Self-pay | Admitting: Interventional Cardiology

## 2022-07-21 ENCOUNTER — Other Ambulatory Visit: Payer: Medicare PPO

## 2022-10-12 DIAGNOSIS — N401 Enlarged prostate with lower urinary tract symptoms: Secondary | ICD-10-CM | POA: Diagnosis not present

## 2022-10-12 DIAGNOSIS — R3915 Urgency of urination: Secondary | ICD-10-CM | POA: Diagnosis not present

## 2022-10-12 DIAGNOSIS — R310 Gross hematuria: Secondary | ICD-10-CM | POA: Diagnosis not present

## 2022-10-26 ENCOUNTER — Telehealth: Payer: Self-pay | Admitting: *Deleted

## 2022-10-26 NOTE — Telephone Encounter (Signed)
Received message from Dr Eldridge Dace that patient will follow with Dr Bjorn Pippin.

## 2022-12-04 DIAGNOSIS — L814 Other melanin hyperpigmentation: Secondary | ICD-10-CM | POA: Diagnosis not present

## 2022-12-04 DIAGNOSIS — Z85828 Personal history of other malignant neoplasm of skin: Secondary | ICD-10-CM | POA: Diagnosis not present

## 2022-12-04 DIAGNOSIS — D485 Neoplasm of uncertain behavior of skin: Secondary | ICD-10-CM | POA: Diagnosis not present

## 2022-12-04 DIAGNOSIS — D1801 Hemangioma of skin and subcutaneous tissue: Secondary | ICD-10-CM | POA: Diagnosis not present

## 2022-12-04 DIAGNOSIS — L821 Other seborrheic keratosis: Secondary | ICD-10-CM | POA: Diagnosis not present

## 2022-12-04 DIAGNOSIS — L570 Actinic keratosis: Secondary | ICD-10-CM | POA: Diagnosis not present

## 2022-12-04 DIAGNOSIS — L820 Inflamed seborrheic keratosis: Secondary | ICD-10-CM | POA: Diagnosis not present

## 2023-02-12 DIAGNOSIS — J208 Acute bronchitis due to other specified organisms: Secondary | ICD-10-CM | POA: Diagnosis not present

## 2023-02-12 DIAGNOSIS — B9689 Other specified bacterial agents as the cause of diseases classified elsewhere: Secondary | ICD-10-CM | POA: Diagnosis not present

## 2023-02-19 DIAGNOSIS — R058 Other specified cough: Secondary | ICD-10-CM | POA: Diagnosis not present

## 2023-04-06 DIAGNOSIS — M25552 Pain in left hip: Secondary | ICD-10-CM | POA: Diagnosis not present

## 2023-04-06 DIAGNOSIS — M5106 Intervertebral disc disorders with myelopathy, lumbar region: Secondary | ICD-10-CM | POA: Diagnosis not present

## 2023-04-16 DIAGNOSIS — M545 Low back pain, unspecified: Secondary | ICD-10-CM | POA: Diagnosis not present

## 2023-04-16 DIAGNOSIS — J309 Allergic rhinitis, unspecified: Secondary | ICD-10-CM | POA: Diagnosis not present

## 2023-05-07 DIAGNOSIS — R3915 Urgency of urination: Secondary | ICD-10-CM | POA: Diagnosis not present

## 2023-05-07 DIAGNOSIS — N401 Enlarged prostate with lower urinary tract symptoms: Secondary | ICD-10-CM | POA: Diagnosis not present

## 2023-05-30 DIAGNOSIS — Z85828 Personal history of other malignant neoplasm of skin: Secondary | ICD-10-CM | POA: Diagnosis not present

## 2023-05-30 DIAGNOSIS — L821 Other seborrheic keratosis: Secondary | ICD-10-CM | POA: Diagnosis not present

## 2023-05-30 DIAGNOSIS — D225 Melanocytic nevi of trunk: Secondary | ICD-10-CM | POA: Diagnosis not present

## 2023-05-30 DIAGNOSIS — L814 Other melanin hyperpigmentation: Secondary | ICD-10-CM | POA: Diagnosis not present

## 2023-05-30 DIAGNOSIS — B0089 Other herpesviral infection: Secondary | ICD-10-CM | POA: Diagnosis not present

## 2023-05-30 DIAGNOSIS — L570 Actinic keratosis: Secondary | ICD-10-CM | POA: Diagnosis not present

## 2023-05-30 DIAGNOSIS — D2272 Melanocytic nevi of left lower limb, including hip: Secondary | ICD-10-CM | POA: Diagnosis not present

## 2023-06-12 NOTE — Progress Notes (Unsigned)
 Cardiology Office Note:    Date:  06/15/2023   ID:  Glover Frederick, DOB [birth year], MRN [id number]  PCP:  [name], PA  Cardiologist:  [name], MD  Electrophysiologist:  None   Referring MD: [name], PA   Chief Complaint  Patient presents with   Atrial Fibrillation    History of Present Illness:    Frederick Glover is a 83 y.o. male with a hx of atrial fibrillation who presents for follow-up.  Previously followed with Dr. Eldridge Frederick, last seen 05/2022.  He also had followed with Dr. Johney Glover in EP and underwent A-fib ablation in 2011.  Had not had evidence of recurrence and was taken off anticoagulation.  He reported chest pain and underwent stress PET 07/2022, which showed normal perfusion, normal myocardial blood flow reserve, severe coronary calcifications, LVEF 52%.  Since last clinic visit, he report he is doing okay.  Reports sharp pain on left side that he was having last year has improved.  Denies any chest pain, dyspnea, lightheadedness, syncope, lower extremity edema, or palpitations.   Past Medical History:  Diagnosis Date   Allergic rhinitis    Atrial fibrillation (HCC)    atrial flutter PVI/CTI ablation 7/11   BPH (benign prostatic hyperplasia)    Chest pain    DJD (degenerative joint disease)    Hearing loss    Herpes simplex    , left butt   Plantar fasciitis    Rheumatic fever    denies h/o    Tinnitus     Past Surgical History:  Procedure Laterality Date   afib/atrial flutter ablation  7/11   afib ablation by JA   arthroscopic knee surgery     HERNIA REPAIR     LAMINECTOMY     s/p appy     VASECTOMY      Current Medications: Current Meds  Medication Sig   acyclovir (ZOVIRAX) 400 MG tablet 2 tablets twice a day for 5 days as needed for flare of fever blister (Patient taking differently: Take 800 mg by mouth 2 (two) times daily as needed (for 5 days during a flair up).)   aspirin EC 81 MG tablet Take 1 tablet by mouth  daily.   fexofenadine (ALLEGRA) 180 MG tablet Take 180 mg by mouth daily. allergies PRN   finasteride (PROSCAR) 5 MG tablet Take 1 tablet (5 mg total) by mouth daily.   fluoruracil (CARAC) 0.5 % cream Apply to skin twice daily for 2 weeks as needed for pre-cancerous skin lesions   fluticasone (FLONASE ALLERGY RELIEF) 50 MCG/ACT nasal spray    Glucosamine-Chondroit-Vit C-Mn (GLUCOSAMINE-CHONDROITIN) CAPS Take 2 capsules by mouth daily.    Multiple Vitamins-Minerals (MULTIVITAL) tablet Take 1 tablet by mouth daily.     tamsulosin (FLOMAX) 0.4 MG CAPS capsule Take 1 capsule (0.4 mg total) by mouth daily after supper.     Allergies:   Patient has no known allergies.   Social History   Socioeconomic History   Marital status: Married    Spouse name: Not on file   Number of children: Not on file   Years of education: Not on file   Highest education level: Not on file  Occupational History   Not on file  Tobacco Use   Smoking status: Never   Smokeless tobacco: Never  Substance and Sexual Activity   Alcohol use: Yes    Comment: rare etoh    Drug use: No   Sexual activity: Not on file  Other Topics Concern   Not on file  Social History Narrative   Lives in [place] with spouse. Retired Psychologist, forensic at [employer]. RAre ETOH. Denies Tob/drugs.    Social Drivers of Corporate investment banker Strain: Not on file  Food Insecurity: No Food Insecurity (06/15/2022)   Hunger Vital Sign    Worried About Running Out of Food in the Last Year: Never true    Ran Out of Food in the Last Year: Never true  Transportation Needs: No Transportation Needs (06/15/2022)   PRAPARE - Administrator, Civil Service (Medical): No    Lack of Transportation (Non-Medical): No  Physical Activity: Not on file  Stress: Not on file  Social Connections: Not on file     Family History: The patient's family history includes Atrial fibrillation in his father; Heart attack in his brother;  Parkinsonism in his father; Stroke in his mother. There is no history of Hypertension.  ROS:   Please see the history of present illness.     All other systems reviewed and are negative.  EKGs/Labs/Other Studies Reviewed:    The following studies were reviewed today:   EKG:   06/15/2023: Normal sinus rhythm, rate 86  Recent Labs: 06/17/2022: Platelets 179 06/18/2022: BUN 16; Creatinine, Ser 0.70; Hemoglobin 13.3; Potassium 3.7; Sodium 140  Recent Lipid Panel No results found for: "CHOL", "TRIG", "HDL", "CHOLHDL", "VLDL", "LDLCALC", "LDLDIRECT"  Physical Exam:    VS:  BP 136/88   Pulse 88   Ht 5\' 10"  (1.778 m)   Wt 201 lb 9.6 oz (91.4 kg)   SpO2 98%   BMI 28.93 kg/m     Wt Readings from Last 3 Encounters:  06/15/23 201 lb 9.6 oz (91.4 kg)  06/18/22 190 lb (86.2 kg)  06/17/22 190 lb 0.6 oz (86.2 kg)     GEN:  Well nourished, well developed in no acute distress HEENT: Normal NECK: No JVD; No carotid bruits LYMPHATICS: No lymphadenopathy CARDIAC: RRR, no murmurs, rubs, gallops RESPIRATORY:  Clear to auscultation without rales, wheezing or rhonchi  ABDOMEN: Soft, non-tender, non-distended MUSCULOSKELETAL:  No edema; No deformity  SKIN: Warm and dry NEUROLOGIC:  Alert and oriented x 3 PSYCHIATRIC:  Normal affect   ASSESSMENT:    1. Coronary artery disease involving native coronary artery of native heart without angina pectoris   2. Paroxysmal atrial fibrillation (HCC)   3. Aortic dilatation (HCC)    PLAN:    CAD: reported atypical chest pain and underwent stress PET 07/2022, which showed normal perfusion, normal myocardial blood flow reserve, severe coronary calcifications, LVEF 52%. -Recommend starting statin given severe coronary calcifications on CT during stress PET.  Will check lipid panel to guide statin selection  Atrial fibrillation: had followed with Dr. Johney Glover in EP and underwent A-fib ablation in 2011.  Had not had evidence of recurrence and was taken off  anticoagulation. - Reports he was asymptomatic with A-fib.  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  Recommend checking echocardiogram to monitor  RTC in 6 months   Medication Adjustments/Labs and Tests Ordered: Current medicines are reviewed at length with the patient today.  Concerns regarding medicines are outlined above.  Orders Placed This Encounter  Procedures   Lipid Profile   LONG TERM MONITOR (3-14 DAYS)   EKG 12-Lead   ECHOCARDIOGRAM COMPLETE   No orders of the defined types were placed  in this encounter.   Patient Instructions  Medication Instructions:  The current medical regimen is effective;  continue present plan and medications as directed. Please refer to the Current Medication list given to you today.  *If you need a refill on your cardiac medications before your next appointment, please call your pharmacy*  Lab Work: FASTING LIPID PANES NEXT WEEK If you have labs (blood work) drawn today and your tests are completely normal, you will receive your results only by:  MyChart Message (if you have MyChart) OR A paper copy in the mail If you have any lab test that is abnormal or we need to change your treatment, we will call you to review the results.  Testing/Procedures: Your physician has requested that you have an echocardiogram. Echocardiography is a painless test that uses sound waves to create images of your heart. It provides your doctor with information about the size and shape of your heart and how well your heart's chambers and valves are working. This procedure takes approximately one hour. There are no restrictions for this procedure. Please do NOT wear cologne, perfume, aftershave, or lotions (deodorant is allowed). Please arrive 15 minutes prior to your appointment time.  Please note: We ask at that you not bring children with you during ultrasound (echo/ vascular)  testing. Due to room size and safety concerns, children are not allowed in the ultrasound rooms during exams. Our front office staff cannot provide observation of children in our lobby area while testing is being conducted. An adult accompanying a patient to their appointment will only be allowed in the ultrasound room at the discretion of the ultrasound technician under special circumstances. We apologize for any inconvenience.  Your physician has requested you wear a ZIO patch monitor for 14 days. SEE BELOW  Follow-Up: At [facility], you and your health needs are our priority.  As part of our continuing mission to provide you with exceptional heart care, our providers are all part of one team.  This team includes your primary Cardiologist (physician) and Advanced Practice Providers or APPs (Physician Assistants and Nurse Practitioners) who all work together to provide you with the care you need, when you need it.  Your next appointment:   6 month(s)  Provider:   [name], MD         Christena Deem- Long Term Monitor Instructions  Your physician has requested you wear a ZIO patch monitor for 14 days.  This is a single patch monitor. [employer] supplies one patch monitor per enrollment. Additional stickers are not available. Please do not apply patch if you will be having a Nuclear Stress Test,  Echocardiogram, Cardiac CT, MRI, or Chest Xray during the period you would be wearing the  monitor. The patch cannot be worn during these tests. You cannot remove and re-apply the  ZIO XT patch monitor.  Your ZIO patch monitor will be mailed 3 day USPS to your address on file. It may take 3-5 days  to receive your monitor after you have been enrolled.  Once you have received your monitor, please review the enclosed instructions. Your monitor  has already been registered assigning a specific monitor serial # to you.  Billing and Patient Assistance Program Information  We have  supplied [employer] with any of your insurance information on file for billing purposes. [employer] offers a sliding scale Patient Assistance Program for patients that do not have  insurance, or whose insurance does not completely cover the cost of the  ZIO monitor.  You must apply for the Patient Assistance Program to qualify for this discounted rate.  To apply, please call [employer] at [phone], select option 4, select option 2, ask to apply for  Patient Assistance Program. Meredeth Ide will ask your household income, and how many people  are in your household. They will quote your out-of-pocket cost based on that information.  [employer] will also be able to set up a 13-month, interest-free payment plan if needed.  Applying the monitor   Shave hair from upper left chest.  Hold abrader disc by orange tab. Rub abrader in 40 strokes over the upper left chest as  indicated in your monitor instructions.  Clean area with 4 enclosed alcohol pads. Let dry.  Apply patch as indicated in monitor instructions. Patch will be placed under collarbone on left  side of chest with arrow pointing upward.  Rub patch adhesive wings for 2 minutes. Remove white label marked "1". Remove the white  label marked "2". Rub patch adhesive wings for 2 additional minutes.  While looking in a mirror, press and release button in center of patch. A small green light will  flash 3-4 times. This will be your only indicator that the monitor has been turned on.  Do not shower for the first 24 hours. You may shower after the first 24 hours.  Press the button if you feel a symptom. You will hear a small click. Record Date, Time and  Symptom in the Patient Logbook.  When you are ready to remove the patch, follow instructions on the last 2 pages of Patient  Logbook. Stick patch monitor onto the last page of Patient Logbook.  Place Patient Logbook in the blue and white box. Use locking tab on box and tape box closed  securely. The blue and  white box has prepaid postage on it. Please place it in the mailbox as  soon as possible. Your physician should have your test results approximately 7 days after the  monitor has been mailed back to [facility].  Call [facility] Customer Care at [phone] if you have questions regarding  your ZIO XT patch monitor. Call them immediately if you see an orange light blinking on your  monitor.  If your monitor falls off in less than 4 days, contact our Monitor department at [phone].  If your monitor becomes loose or falls off after 4 days call [employer] at [phone] for  suggestions on securing your monitor    1st Floor: - Lobby - Registration  - Pharmacy  - Lab - Cafe  2nd Floor: - PV Lab - Diagnostic Testing (echo, CT, nuclear med)  3rd Floor: - Vacant  4th Floor: - TCTS (cardiothoracic surgery) - AFib Clinic - Structural Heart Clinic - Vascular Surgery  - Vascular Ultrasound  5th Floor: - HeartCare Cardiology (general and EP) - Clinical Pharmacy for coumadin, hypertension, lipid, weight-loss medications, and med management appointments    Valet parking services will be available as well.      Signed, [name], MD  06/15/2023 5:01 PM    [facility]

## 2023-06-15 ENCOUNTER — Encounter: Payer: Self-pay | Admitting: Cardiology

## 2023-06-15 ENCOUNTER — Ambulatory Visit (INDEPENDENT_AMBULATORY_CARE_PROVIDER_SITE_OTHER)

## 2023-06-15 ENCOUNTER — Ambulatory Visit: Payer: Medicare PPO | Attending: Cardiology | Admitting: Cardiology

## 2023-06-15 VITALS — BP 136/88 | HR 88 | Ht 70.0 in | Wt 201.6 lb

## 2023-06-15 DIAGNOSIS — I480 Paroxysmal atrial fibrillation: Secondary | ICD-10-CM | POA: Diagnosis not present

## 2023-06-15 DIAGNOSIS — I77819 Aortic ectasia, unspecified site: Secondary | ICD-10-CM

## 2023-06-15 DIAGNOSIS — I2510 Atherosclerotic heart disease of native coronary artery without angina pectoris: Secondary | ICD-10-CM | POA: Diagnosis not present

## 2023-06-15 NOTE — Patient Instructions (Signed)
 Medication Instructions:  The current medical regimen is effective;  continue present plan and medications as directed. Please refer to the Current Medication list given to you today.  *If you need a refill on your cardiac medications before your next appointment, please call your pharmacy*  Lab Work: FASTING LIPID PANES NEXT WEEK If you have labs (blood work) drawn today and your tests are completely normal, you will receive your results only by:  MyChart Message (if you have MyChart) OR A paper copy in the mail If you have any lab test that is abnormal or we need to change your treatment, we will call you to review the results.  Testing/Procedures: Your physician has requested that you have an echocardiogram. Echocardiography is a painless test that uses sound waves to create images of your heart. It provides your doctor with information about the size and shape of your heart and how well your heart's chambers and valves are working. This procedure takes approximately one hour. There are no restrictions for this procedure. Please do NOT wear cologne, perfume, aftershave, or lotions (deodorant is allowed). Please arrive 15 minutes prior to your appointment time.  Please note: We ask at that you not bring children with you during ultrasound (echo/ vascular) testing. Due to room size and safety concerns, children are not allowed in the ultrasound rooms during exams. Our front office staff cannot provide observation of children in our lobby area while testing is being conducted. An adult accompanying a patient to their appointment will only be allowed in the ultrasound room at the discretion of the ultrasound technician under special circumstances. We apologize for any inconvenience.  Your physician has requested you wear a ZIO patch monitor for 14 days. SEE BELOW  Follow-Up: At [facility], you and your health needs are our priority.  As part of our continuing mission to provide you with  exceptional heart care, our providers are all part of one team.  This team includes your primary Cardiologist (physician) and Advanced Practice Providers or APPs (Physician Assistants and Nurse Practitioners) who all work together to provide you with the care you need, when you need it.  Your next appointment:   6 month(s)  Provider:   [name], MD         Christena Deem- Long Term Monitor Instructions  Your physician has requested you wear a ZIO patch monitor for 14 days.  This is a single patch monitor. [employer] supplies one patch monitor per enrollment. Additional stickers are not available. Please do not apply patch if you will be having a Nuclear Stress Test,  Echocardiogram, Cardiac CT, MRI, or Chest Xray during the period you would be wearing the  monitor. The patch cannot be worn during these tests. You cannot remove and re-apply the  ZIO XT patch monitor.  Your ZIO patch monitor will be mailed 3 day USPS to your address on file. It may take 3-5 days  to receive your monitor after you have been enrolled.  Once you have received your monitor, please review the enclosed instructions. Your monitor  has already been registered assigning a specific monitor serial # to you.  Billing and Patient Assistance Program Information  We have supplied [employer] with any of your insurance information on file for billing purposes. [employer] offers a sliding scale Patient Assistance Program for patients that do not have  insurance, or whose insurance does not completely cover the cost of the ZIO monitor.  You must apply for the Patient  Assistance Program to qualify for this discounted rate.  To apply, please call [employer] at [phone], select option 4, select option 2, ask to apply for  Patient Assistance Program. Meredeth Ide will ask your household income, and how many people  are in your household. They will quote your out-of-pocket cost based on that information.  [employer] will also be able  to set up a 75-month, interest-free payment plan if needed.  Applying the monitor   Shave hair from upper left chest.  Hold abrader disc by orange tab. Rub abrader in 40 strokes over the upper left chest as  indicated in your monitor instructions.  Clean area with 4 enclosed alcohol pads. Let dry.  Apply patch as indicated in monitor instructions. Patch will be placed under collarbone on left  side of chest with arrow pointing upward.  Rub patch adhesive wings for 2 minutes. Remove white label marked "1". Remove the white  label marked "2". Rub patch adhesive wings for 2 additional minutes.  While looking in a mirror, press and release button in center of patch. A small green light will  flash 3-4 times. This will be your only indicator that the monitor has been turned on.  Do not shower for the first 24 hours. You may shower after the first 24 hours.  Press the button if you feel a symptom. You will hear a small click. Record Date, Time and  Symptom in the Patient Logbook.  When you are ready to remove the patch, follow instructions on the last 2 pages of Patient  Logbook. Stick patch monitor onto the last page of Patient Logbook.  Place Patient Logbook in the blue and white box. Use locking tab on box and tape box closed  securely. The blue and white box has prepaid postage on it. Please place it in the mailbox as  soon as possible. Your physician should have your test results approximately 7 days after the  monitor has been mailed back to [facility].  Call [facility] Customer Care at [phone] if you have questions regarding  your ZIO XT patch monitor. Call them immediately if you see an orange light blinking on your  monitor.  If your monitor falls off in less than 4 days, contact our Monitor department at [phone].  If your monitor becomes loose or falls off after 4 days call [employer] at [phone] for  suggestions on securing your monitor    1st Floor: -  Lobby - Registration  - Pharmacy  - Lab - Cafe  2nd Floor: - PV Lab - Diagnostic Testing (echo, CT, nuclear med)  3rd Floor: - Vacant  4th Floor: - TCTS (cardiothoracic surgery) - AFib Clinic - Structural Heart Clinic - Vascular Surgery  - Vascular Ultrasound  5th Floor: - HeartCare Cardiology (general and EP) - Clinical Pharmacy for coumadin, hypertension, lipid, weight-loss medications, and med management appointments    Valet parking services will be available as well.

## 2023-06-15 NOTE — Progress Notes (Unsigned)
 Enrolled patient for a 14 day Zio XT  monitor to be mailed to patients home

## 2023-06-18 ENCOUNTER — Encounter: Payer: Self-pay | Admitting: *Deleted

## 2023-06-19 DIAGNOSIS — I25119 Atherosclerotic heart disease of native coronary artery with unspecified angina pectoris: Secondary | ICD-10-CM | POA: Diagnosis not present

## 2023-06-19 LAB — LIPID PANEL
Chol/HDL Ratio: 4.2 ratio (ref 0.0–5.0)
Cholesterol, Total: 171 mg/dL (ref 100–199)
HDL: 41 mg/dL (ref >39)
LDL Chol Calc (NIH): 112 mg/dL — ABNORMAL HIGH (ref 0–99)
Triglycerides: 98 mg/dL (ref 0–149)
VLDL Cholesterol Cal: 18 mg/dL (ref 5–40)

## 2023-06-20 ENCOUNTER — Telehealth: Payer: Self-pay | Admitting: *Deleted

## 2023-06-20 ENCOUNTER — Encounter: Payer: Self-pay | Admitting: *Deleted

## 2023-06-20 DIAGNOSIS — I2510 Atherosclerotic heart disease of native coronary artery without angina pectoris: Secondary | ICD-10-CM

## 2023-06-20 DIAGNOSIS — I77819 Aortic ectasia, unspecified site: Secondary | ICD-10-CM

## 2023-06-20 MED ORDER — ROSUVASTATIN CALCIUM 10 MG PO TABS: 10.0000 mg | ORAL_TABLET | Freq: Every day | ORAL | 3 refills | Status: AC

## 2023-06-20 NOTE — Telephone Encounter (Signed)
 Called patient and gave Dr. Bjorn Pippin recommendations. Start Rosuvastatin 10 mg daily sent to patient's pharmacy [facility] on [address]. Orders placed for lab work and patient made aware and verbalized understanding.

## 2023-06-22 ENCOUNTER — Ambulatory Visit (HOSPITAL_COMMUNITY): Attending: Internal Medicine

## 2023-06-22 DIAGNOSIS — I4891 Unspecified atrial fibrillation: Secondary | ICD-10-CM | POA: Insufficient documentation

## 2023-06-22 DIAGNOSIS — I77819 Aortic ectasia, unspecified site: Secondary | ICD-10-CM | POA: Insufficient documentation

## 2023-06-22 DIAGNOSIS — I349 Nonrheumatic mitral valve disorder, unspecified: Secondary | ICD-10-CM | POA: Diagnosis not present

## 2023-06-22 DIAGNOSIS — I340 Nonrheumatic mitral (valve) insufficiency: Secondary | ICD-10-CM | POA: Insufficient documentation

## 2023-06-22 LAB — ECHOCARDIOGRAM COMPLETE
Area-P 1/2: 2.8 cm2
S' Lateral: 2.6 cm

## 2023-06-28 ENCOUNTER — Encounter: Payer: Self-pay | Admitting: *Deleted

## 2023-06-28 ENCOUNTER — Other Ambulatory Visit: Payer: Self-pay | Admitting: *Deleted

## 2023-06-28 DIAGNOSIS — I77819 Aortic ectasia, unspecified site: Secondary | ICD-10-CM

## 2023-07-05 DIAGNOSIS — Z23 Encounter for immunization: Secondary | ICD-10-CM | POA: Diagnosis not present

## 2023-07-05 DIAGNOSIS — E7800 Pure hypercholesterolemia, unspecified: Secondary | ICD-10-CM | POA: Diagnosis not present

## 2023-07-05 DIAGNOSIS — R972 Elevated prostate specific antigen [PSA]: Secondary | ICD-10-CM | POA: Diagnosis not present

## 2023-07-05 DIAGNOSIS — R030 Elevated blood-pressure reading, without diagnosis of hypertension: Secondary | ICD-10-CM | POA: Diagnosis not present

## 2023-07-05 DIAGNOSIS — D122 Benign neoplasm of ascending colon: Secondary | ICD-10-CM | POA: Diagnosis not present

## 2023-07-05 DIAGNOSIS — I2510 Atherosclerotic heart disease of native coronary artery without angina pectoris: Secondary | ICD-10-CM | POA: Diagnosis not present

## 2023-07-05 DIAGNOSIS — Z0000 Encounter for general adult medical examination without abnormal findings: Secondary | ICD-10-CM | POA: Diagnosis not present

## 2023-07-05 DIAGNOSIS — I480 Paroxysmal atrial fibrillation: Secondary | ICD-10-CM | POA: Diagnosis not present

## 2023-07-05 DIAGNOSIS — N400 Enlarged prostate without lower urinary tract symptoms: Secondary | ICD-10-CM | POA: Diagnosis not present

## 2023-07-13 DIAGNOSIS — I480 Paroxysmal atrial fibrillation: Secondary | ICD-10-CM | POA: Diagnosis not present

## 2023-07-29 ENCOUNTER — Encounter (HOSPITAL_BASED_OUTPATIENT_CLINIC_OR_DEPARTMENT_OTHER): Payer: Self-pay | Admitting: Cardiology

## 2023-07-30 ENCOUNTER — Ambulatory Visit: Payer: Self-pay | Admitting: Cardiology

## 2023-07-30 DIAGNOSIS — I480 Paroxysmal atrial fibrillation: Secondary | ICD-10-CM | POA: Diagnosis not present

## 2023-07-30 NOTE — Telephone Encounter (Signed)
 See result note.

## 2023-07-30 NOTE — Telephone Encounter (Signed)
 Please review pt heart monitor

## 2023-08-01 DIAGNOSIS — L820 Inflamed seborrheic keratosis: Secondary | ICD-10-CM | POA: Diagnosis not present

## 2023-08-01 DIAGNOSIS — Z85828 Personal history of other malignant neoplasm of skin: Secondary | ICD-10-CM | POA: Diagnosis not present

## 2023-08-01 DIAGNOSIS — D485 Neoplasm of uncertain behavior of skin: Secondary | ICD-10-CM | POA: Diagnosis not present

## 2023-08-01 DIAGNOSIS — B079 Viral wart, unspecified: Secondary | ICD-10-CM | POA: Diagnosis not present

## 2023-08-01 DIAGNOSIS — L03031 Cellulitis of right toe: Secondary | ICD-10-CM | POA: Diagnosis not present

## 2023-08-01 DIAGNOSIS — L0889 Other specified local infections of the skin and subcutaneous tissue: Secondary | ICD-10-CM | POA: Diagnosis not present

## 2023-08-15 DIAGNOSIS — N401 Enlarged prostate with lower urinary tract symptoms: Secondary | ICD-10-CM | POA: Diagnosis not present

## 2023-08-15 DIAGNOSIS — R3915 Urgency of urination: Secondary | ICD-10-CM | POA: Diagnosis not present

## 2023-08-21 ENCOUNTER — Other Ambulatory Visit: Payer: Self-pay

## 2023-08-21 DIAGNOSIS — I77819 Aortic ectasia, unspecified site: Secondary | ICD-10-CM | POA: Diagnosis not present

## 2023-08-21 DIAGNOSIS — I2510 Atherosclerotic heart disease of native coronary artery without angina pectoris: Secondary | ICD-10-CM | POA: Diagnosis not present

## 2023-08-21 LAB — COMPREHENSIVE METABOLIC PANEL WITH GFR
ALT: 21 IU/L (ref 0–44)
AST: 19 IU/L (ref 0–40)
Albumin: 4.4 g/dL (ref 3.7–4.7)
Alkaline Phosphatase: 77 IU/L (ref 44–121)
BUN/Creatinine Ratio: 27 — ABNORMAL HIGH (ref 10–24)
BUN: 20 mg/dL (ref 8–27)
Bilirubin Total: 0.6 mg/dL (ref 0.0–1.2)
CO2: 19 mmol/L — ABNORMAL LOW (ref 20–29)
Calcium: 9.1 mg/dL (ref 8.6–10.2)
Chloride: 106 mmol/L (ref 96–106)
Creatinine, Ser: 0.73 mg/dL — ABNORMAL LOW (ref 0.76–1.27)
Globulin, Total: 2.4 g/dL (ref 1.5–4.5)
Glucose: 98 mg/dL (ref 70–99)
Potassium: 4.1 mmol/L (ref 3.5–5.2)
Sodium: 142 mmol/L (ref 134–144)
Total Protein: 6.8 g/dL (ref 6.0–8.5)
eGFR: 90 mL/min/{1.73_m2} (ref >59)

## 2023-08-21 LAB — LIPID PANEL
Chol/HDL Ratio: 2.5 ratio (ref 0.0–5.0)
Cholesterol, Total: 97 mg/dL — ABNORMAL LOW (ref 100–199)
HDL: 39 mg/dL — ABNORMAL LOW (ref >39)
LDL Chol Calc (NIH): 42 mg/dL (ref 0–99)
Triglycerides: 76 mg/dL (ref 0–149)
VLDL Cholesterol Cal: 16 mg/dL (ref 5–40)

## 2023-08-22 ENCOUNTER — Ambulatory Visit: Payer: Self-pay | Admitting: Cardiology

## 2023-08-27 DIAGNOSIS — R3915 Urgency of urination: Secondary | ICD-10-CM | POA: Diagnosis not present

## 2023-09-04 ENCOUNTER — Other Ambulatory Visit: Payer: Self-pay | Admitting: Urology

## 2023-09-08 ENCOUNTER — Encounter (HOSPITAL_BASED_OUTPATIENT_CLINIC_OR_DEPARTMENT_OTHER): Payer: Self-pay | Admitting: Cardiology

## 2023-09-10 NOTE — Telephone Encounter (Signed)
Routing to correct triage pool

## 2023-09-10 NOTE — Telephone Encounter (Signed)
 In absence of any new symptoms such as chest pain or shortness of breath, should be OK from cardiac standpoint for surgery

## 2023-09-11 NOTE — Patient Instructions (Signed)
 SURGICAL WAITING ROOM VISITATION  Patients having surgery or a procedure may have no more than 2 support people in the waiting area - these visitors may rotate.    Children under the age of 17 must have an adult with them who is not the patient.  Visitors with respiratory illnesses are discouraged from visiting and should remain at home.  If the patient needs to stay at the hospital during part of their recovery, the visitor guidelines for inpatient rooms apply. Pre-op nurse will coordinate an appropriate time for 1 support person to accompany patient in pre-op.  This support person may not rotate.    Please refer to the [facility] website for the visitor guidelines for Inpatients (after your surgery is over and you are in a regular room).       Your procedure is scheduled on: 09/21/23   Report to [facility] Main Entrance    Report to admitting at : 9:15 AM   Call this number if you have problems the morning of surgery [phone]   Do not eat food or drink : After Midnight.  FOLLOW ANY ADDITIONAL PRE OP INSTRUCTIONS YOU RECEIVED FROM YOUR SURGEON'S OFFICE!!!   Oral Hygiene is also important to reduce your risk of infection.                                    Remember - BRUSH YOUR TEETH THE MORNING OF SURGERY WITH YOUR REGULAR TOOTHPASTE  DENTURES WILL BE REMOVED PRIOR TO SURGERY PLEASE DO NOT APPLY Poly grip OR ADHESIVES!!!   Do NOT smoke after Midnight   Stop all vitamins and herbal supplements 7 days before surgery.   Take these medicines the morning of surgery with A SIP OF WATER: finasteride .             You may not have any metal on your body including hair pins, jewelry, and body piercing             Do not wear lotions, powders, perfumes/cologne, or deodorant              Men may shave face and neck.   Do not bring valuables to the hospital. [facility] IS NOT             RESPONSIBLE   FOR VALUABLES.   Contacts, glasses, dentures or bridgework may  not be worn into surgery.   Bring small overnight bag day of surgery.   DO NOT BRING YOUR HOME MEDICATIONS TO THE HOSPITAL. PHARMACY WILL DISPENSE MEDICATIONS LISTED ON YOUR MEDICATION LIST TO YOU DURING YOUR ADMISSION IN THE HOSPITAL!    Patients discharged on the day of surgery will not be allowed to drive home.  Someone NEEDS to stay with you for the first 24 hours after anesthesia.   Special Instructions: Bring a copy of your healthcare power of attorney and living will documents the day of surgery if you haven't scanned them before.              Please read over the following fact sheets you were given: IF YOU HAVE QUESTIONS ABOUT YOUR PRE-OP INSTRUCTIONS PLEASE CALL [phone]   If you received a COVID test during your pre-op visit  it is requested that you wear a mask when out in public, stay away from anyone that may not be feeling well and notify your surgeon if you develop symptoms. If you test  positive for Covid or have been in contact with anyone that has tested positive in the last 10 days please notify you surgeon.    [facility] - Preparing for Surgery Before surgery, you can play an important role.  Because skin is not sterile, your skin needs to be as free of germs as possible.  You can reduce the number of germs on your skin by washing with CHG (chlorahexidine gluconate) soap before surgery.  CHG is an antiseptic cleaner which kills germs and bonds with the skin to continue killing germs even after washing. Please DO NOT use if you have an allergy to CHG or antibacterial soaps.  If your skin becomes reddened/irritated stop using the CHG and inform your nurse when you arrive at Short Stay. Do not shave (including legs and underarms) for at least 48 hours prior to the first CHG shower.  You may shave your face/neck. Please follow these instructions carefully:  1.  Shower with CHG Soap the night before surgery and the  morning of Surgery.  2.  If you choose to wash your hair,  wash your hair first as usual with your  normal  shampoo.  3.  After you shampoo, rinse your hair and body thoroughly to remove the  shampoo.                           4.  Use CHG as you would any other liquid soap.  You can apply chg directly  to the skin and wash                       Gently with a scrungie or clean washcloth.  5.  Apply the CHG Soap to your body ONLY FROM THE NECK DOWN.   Do not use on face/ open                           Wound or open sores. Avoid contact with eyes, ears mouth and genitals (private parts).                       Wash face,  Genitals (private parts) with your normal soap.             6.  Wash thoroughly, paying special attention to the area where your surgery  will be performed.  7.  Thoroughly rinse your body with warm water from the neck down.  8.  DO NOT shower/wash with your normal soap after using and rinsing off  the CHG Soap.                9.  Pat yourself dry with a clean towel.            10.  Wear clean pajamas.            11.  Place clean sheets on your bed the night of your first shower and do not  sleep with pets. Day of Surgery : Do not apply any lotions/deodorants the morning of surgery.  Please wear clean clothes to the hospital/surgery center.  FAILURE TO FOLLOW THESE INSTRUCTIONS MAY RESULT IN THE CANCELLATION OF YOUR SURGERY PATIENT SIGNATURE_________________________________  NURSE SIGNATURE__________________________________  ________________________________________________________________________

## 2023-09-12 ENCOUNTER — Encounter (HOSPITAL_COMMUNITY)
Admission: RE | Admit: 2023-09-12 | Discharge: 2023-09-12 | Disposition: A | Discharge status: Home / Self Care | Source: Ambulatory Visit | Attending: Urology | Admitting: Urology

## 2023-09-12 ENCOUNTER — Encounter (HOSPITAL_COMMUNITY): Payer: Self-pay

## 2023-09-12 ENCOUNTER — Other Ambulatory Visit: Payer: Self-pay

## 2023-09-12 VITALS — BP 150/92 | HR 88 | Temp 97.8°F | Ht 71.0 in | Wt 198.0 lb

## 2023-09-12 DIAGNOSIS — M1990 Unspecified osteoarthritis, unspecified site: Secondary | ICD-10-CM | POA: Diagnosis not present

## 2023-09-12 DIAGNOSIS — N138 Other obstructive and reflux uropathy: Secondary | ICD-10-CM | POA: Diagnosis not present

## 2023-09-12 DIAGNOSIS — Z9889 Other specified postprocedural states: Secondary | ICD-10-CM | POA: Insufficient documentation

## 2023-09-12 DIAGNOSIS — Z79899 Other long term (current) drug therapy: Secondary | ICD-10-CM | POA: Diagnosis not present

## 2023-09-12 DIAGNOSIS — N401 Enlarged prostate with lower urinary tract symptoms: Secondary | ICD-10-CM | POA: Insufficient documentation

## 2023-09-12 DIAGNOSIS — I2510 Atherosclerotic heart disease of native coronary artery without angina pectoris: Secondary | ICD-10-CM | POA: Diagnosis not present

## 2023-09-12 DIAGNOSIS — Z01812 Encounter for preprocedural laboratory examination: Secondary | ICD-10-CM | POA: Insufficient documentation

## 2023-09-12 DIAGNOSIS — Z01818 Encounter for other preprocedural examination: Secondary | ICD-10-CM | POA: Diagnosis present

## 2023-09-12 DIAGNOSIS — I480 Paroxysmal atrial fibrillation: Secondary | ICD-10-CM | POA: Insufficient documentation

## 2023-09-12 HISTORY — DX: Atherosclerotic heart disease of native coronary artery without angina pectoris: I25.10

## 2023-09-12 HISTORY — DX: Cardiac arrhythmia, unspecified: I49.9

## 2023-09-12 LAB — BASIC METABOLIC PANEL WITH GFR
Anion gap: 9 (ref 5–15)
BUN: 26 mg/dL — ABNORMAL HIGH (ref 8–23)
CO2: 23 mmol/L (ref 22–32)
Calcium: 9.4 mg/dL (ref 8.9–10.3)
Chloride: 106 mmol/L (ref 98–111)
Creatinine, Ser: 0.65 mg/dL (ref 0.61–1.24)
GFR, Estimated: >60 mL/min (ref >60)
Glucose, Bld: 125 mg/dL — ABNORMAL HIGH (ref 70–99)
Potassium: 4.1 mmol/L (ref 3.5–5.1)
Sodium: 138 mmol/L (ref 135–145)

## 2023-09-12 LAB — CBC
HCT: 43.6 % (ref 39.0–52.0)
Hemoglobin: 14.6 g/dL (ref 13.0–17.0)
MCH: 30.3 pg (ref 26.0–34.0)
MCHC: 33.5 g/dL (ref 30.0–36.0)
MCV: 90.5 fL (ref 80.0–100.0)
Platelets: 191 10*3/uL (ref 150–400)
RBC: 4.82 MIL/uL (ref 4.22–5.81)
RDW: 14.2 % (ref 11.5–15.5)
WBC: 6.3 10*3/uL (ref 4.0–10.5)
nRBC: 0 % (ref 0.0–0.2)

## 2023-09-12 NOTE — Progress Notes (Signed)
 For Anesthesia: PCP - Cleotilde, Virginia  E, PA . Cardiologist - Kate Lonni CROME, MD . ARNETTA: 06/15/23: Clear: 09/08/23  Bowel Prep reminder:  Chest x-ray -  EKG - 06/15/23 Stress Test -07/2022  ECHO - 06/28/23 Cardiac Cath -  Pacemaker/ICD device last checked: Pacemaker orders received: Device Rep notified:  Spinal Cord Stimulator:N/A  Sleep Study - N/A CPAP -   Fasting Blood Sugar - N/A Checks Blood Sugar _____ times a day Date and result of last Hgb A1c-  Last dose of GLP1 agonist- N/A GLP1 instructions:   Last dose of SGLT-2 inhibitors- N/A SGLT-2 instructions:   Blood Thinner Instructions: Aspirin Instructions: No instructions Last Dose:  Activity level: Can go up a flight of stairs and activities of daily living without stopping and without chest pain and/or shortness of breath   Able to exercise without chest pain and/or shortness of breath  Anesthesia review: Hx: Afib,CAD  Patient denies shortness of breath, fever, cough and chest pain at PAT appointment   Patient verbalized understanding of instructions that were reviewed over the telephone.

## 2023-09-13 DIAGNOSIS — N401 Enlarged prostate with lower urinary tract symptoms: Secondary | ICD-10-CM | POA: Diagnosis not present

## 2023-09-17 ENCOUNTER — Encounter (HOSPITAL_COMMUNITY): Payer: Self-pay

## 2023-09-17 NOTE — Progress Notes (Signed)
 Case: [id number] Date/Time: 09/21/23 1115   Procedure: ABLATION, PROSTATE, TRANSURETHRAL, USING WATERJET   Anesthesia type: General   Diagnosis: Enlarged prostate with urinary obstruction [N40.1, N13.8]   Pre-op diagnosis: BENIGN PROSTATIC HYPERPLASIA   Location: [facility] PROCEDURE RO[facility] / [facility]   Surgeons: Nieves Cough, MD       DISCUSSION: Frederick Glover is an 83 yo male who presents to PAT prior to surgery above. PMH of A.fib, CAD, arthritis.  Patient follows with cardiology for history of A-fib s/p ablation in 2011.  Also has severe coronary calcifications by imaging but had negative stress PET in May 2024.  Last seen in clinic on 06/15/2023.  Noted to be doing well.  He is on medical therapy for his CAD.  He is off anticoagulation after the ablation.  Cleared for surgery by Dr. Kate: In absence of any new symptoms such as chest pain or shortness of breath, should be OK from cardiac standpoint for surgery  VS: BP (!) 150/92   Pulse 88   Temp 36.6 C (Oral)   Ht 5' 11 (1.803 m)   Wt 89.8 kg   SpO2 96%   BMI 27.62 kg/m   PROVIDERS: Cleotilde, Virginia  E, PA   LABS: Labs reviewed: Acceptable for surgery. (all labs ordered are listed, but only abnormal results are displayed)  Labs Reviewed  BASIC METABOLIC PANEL WITH GFR - Abnormal; Notable for the following components:      Result Value   Glucose, Bld 125 (*)    BUN 26 (*)    All other components within normal limits  CBC     IMAGES:   EKG 06/15/23:  Normal sinus rhythm, rate 86  CV:  Cardiac monitor 07/13/2023:    13 episodes of SVT, longest lasting 11 seconds   Occasional PVCs (1.6% of beats)   Echo 06/22/2023:  IMPRESSIONS    1. Left ventricular ejection fraction, by estimation, is 60 to 65%. Left ventricular ejection fraction by 3D volume is 64 %. The left ventricle has normal function. The left ventricle has no regional wall motion abnormalities. There is mild concentric left ventricular hypertrophy.  Left ventricular diastolic parameters are consistent with Grade I diastolic dysfunction (impaired relaxation). The average left ventricular global longitudinal strain is -21.8 %. The global longitudinal strain is normal.  2. Right ventricular systolic function is normal. The right ventricular size is normal.  3. Left atrial size was moderately dilated.  4. The mitral valve is normal in structure. Mild mitral valve regurgitation. No evidence of mitral stenosis.  5. The aortic valve is normal in structure. Aortic valve regurgitation is trivial. No aortic stenosis is present.  6. Pulmonic valve regurgitation is moderate.  7. Aortic dilatation noted. 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  Nuclear medicine PET stress test 07/12/2022:  Narrative & Impression     LV perfusion is normal. There is no evidence of ischemia. There is no evidence of infarction.   Rest left ventricular function is normal. Rest EF: 52 %. Stress left ventricular function is normal. Stress EF: 59 %. End diastolic cavity size is normal. End systolic cavity size is normal.   Myocardial blood flow was computed to be 0.72ml/g/min at rest and 2.16ml/g/min at stress. Global myocardial blood flow reserve was 2.63 and was normal.   Coronary calcium  was present on the attenuation correction CT images. Severe coronary calcifications were present. Coronary calcifications were present in  the left anterior descending artery, left circumflex artery and right coronary artery distribution(s).   The study is normal. The study is low risk. Past Medical History:  Diagnosis Date   Allergic rhinitis    Atrial fibrillation (HCC)    atrial flutter PVI/CTI ablation 7/11   BPH (benign prostatic hyperplasia)    Chest pain    Coronary artery disease    DJD (degenerative joint disease)    Dysrhythmia    Hearing loss    Herpes simplex    , left butt    Plantar fasciitis    Rheumatic fever    denies h/o    Tinnitus     Past Surgical History:  Procedure Laterality Date   afib/atrial flutter ablation  09/10/2009   afib ablation by JA   arthroscopic knee surgery Bilateral    CATARACT EXTRACTION, BILATERAL     COLONOSCOPY     HERNIA REPAIR     LAMINECTOMY     s/p appy     TONSILLECTOMY     VASECTOMY      MEDICATIONS:  acetaminophen  (TYLENOL ) 650 MG CR tablet   acyclovir  (ZOVIRAX ) 400 MG tablet   aspirin EC 81 MG tablet   Boswellia-Glucosamine-Vit D (OSTEO BI-FLEX ONE PER DAY) TABS   finasteride  (PROSCAR ) 5 MG tablet   ibuprofen (ADVIL) 200 MG tablet   Multiple Vitamins-Minerals (MULTIVITAL) tablet   rosuvastatin  (CRESTOR ) 10 MG tablet   tamsulosin  (FLOMAX ) 0.4 MG CAPS capsule   No current facility-administered medications for this encounter.   Burnard CHRISTELLA Odis DEVONNA [facility] Surgical Short Stay/Anesthesiology [facility] Phone [phone] 09/17/2023 1:34 PM

## 2023-09-21 ENCOUNTER — Encounter (HOSPITAL_COMMUNITY)
Admission: RE | Disposition: A | Discharge status: Home / Self Care | Payer: Self-pay | Source: Ambulatory Visit | Attending: Urology

## 2023-09-21 ENCOUNTER — Encounter (HOSPITAL_COMMUNITY): Payer: Self-pay | Admitting: Urology

## 2023-09-21 ENCOUNTER — Ambulatory Visit (HOSPITAL_COMMUNITY)
Admission: RE | Admit: 2023-09-21 | Discharge: 2023-09-21 | Disposition: A | Discharge status: Home / Self Care | Source: Ambulatory Visit | Attending: Urology | Admitting: Urology

## 2023-09-21 ENCOUNTER — Ambulatory Visit (HOSPITAL_BASED_OUTPATIENT_CLINIC_OR_DEPARTMENT_OTHER): Payer: Self-pay | Admitting: Anesthesiology

## 2023-09-21 ENCOUNTER — Ambulatory Visit (HOSPITAL_COMMUNITY): Payer: Self-pay | Admitting: Physician Assistant

## 2023-09-21 DIAGNOSIS — E785 Hyperlipidemia, unspecified: Secondary | ICD-10-CM | POA: Insufficient documentation

## 2023-09-21 DIAGNOSIS — I2510 Atherosclerotic heart disease of native coronary artery without angina pectoris: Secondary | ICD-10-CM | POA: Diagnosis not present

## 2023-09-21 DIAGNOSIS — N138 Other obstructive and reflux uropathy: Secondary | ICD-10-CM

## 2023-09-21 DIAGNOSIS — N400 Enlarged prostate without lower urinary tract symptoms: Secondary | ICD-10-CM | POA: Diagnosis not present

## 2023-09-21 DIAGNOSIS — R3912 Poor urinary stream: Secondary | ICD-10-CM | POA: Diagnosis not present

## 2023-09-21 DIAGNOSIS — N401 Enlarged prostate with lower urinary tract symptoms: Secondary | ICD-10-CM | POA: Diagnosis not present

## 2023-09-21 DIAGNOSIS — R350 Frequency of micturition: Secondary | ICD-10-CM | POA: Diagnosis not present

## 2023-09-21 SURGERY — ABLATION, PROSTATE, TRANSURETHRAL, USING WATERJET
Anesthesia: General | Site: Prostate

## 2023-09-21 MED ORDER — DEXAMETHASONE SODIUM PHOSPHATE 10 MG/ML IJ SOLN
INTRAMUSCULAR | Status: DC | PRN
  Administered 2023-09-21: 10 mg via INTRAVENOUS

## 2023-09-21 MED ORDER — STERILE WATER FOR IRRIGATION IR SOLN
Status: DC | PRN
  Administered 2023-09-21: 1000 mL

## 2023-09-21 MED ORDER — FENTANYL CITRATE (PF) 100 MCG/2ML IJ SOLN
INTRAMUSCULAR | Status: DC | PRN
  Administered 2023-09-21: 25 ug via INTRAVENOUS
  Administered 2023-09-21: 50 ug via INTRAVENOUS

## 2023-09-21 MED ORDER — FENTANYL CITRATE (PF) 100 MCG/2ML IJ SOLN
INTRAMUSCULAR | Status: AC
  Filled 2023-09-21: qty 2

## 2023-09-21 MED ORDER — AMISULPRIDE (ANTIEMETIC) 5 MG/2ML IV SOLN: 10.0000 mg | Freq: Once | INTRAVENOUS | Status: DC | PRN

## 2023-09-21 MED ORDER — SUGAMMADEX SODIUM 200 MG/2ML IV SOLN
INTRAVENOUS | Status: DC | PRN
Start: 2023-09-21 — End: 2023-09-21
  Administered 2023-09-21: 200 mg via INTRAVENOUS

## 2023-09-21 MED ORDER — LIDOCAINE HCL (PF) 2 % IJ SOLN
INTRAMUSCULAR | Status: DC | PRN
  Administered 2023-09-21: 100 mg via INTRADERMAL

## 2023-09-21 MED ORDER — SODIUM CHLORIDE 0.9 % IV SOLN
2.0000 g | INTRAVENOUS | Status: AC
  Administered 2023-09-21: 2 g via INTRAVENOUS
  Filled 2023-09-21: qty 20

## 2023-09-21 MED ORDER — CEPHALEXIN 500 MG PO CAPS: 500.0000 mg | ORAL_CAPSULE | Freq: Every day | ORAL | 0 refills | Status: DC

## 2023-09-21 MED ORDER — PROPOFOL 10 MG/ML IV BOLUS
INTRAVENOUS | Status: DC | PRN
  Administered 2023-09-21: 50 mg via INTRAVENOUS
  Administered 2023-09-21: 150 mg via INTRAVENOUS

## 2023-09-21 MED ORDER — LIDOCAINE HCL (PF) 2 % IJ SOLN
INTRAMUSCULAR | Status: AC
  Filled 2023-09-21: qty 5

## 2023-09-21 MED ORDER — OXYCODONE HCL 5 MG/5ML PO SOLN: 5.0000 mg | Freq: Once | ORAL | Status: DC | PRN

## 2023-09-21 MED ORDER — ROCURONIUM BROMIDE 10 MG/ML (PF) SYRINGE
PREFILLED_SYRINGE | INTRAVENOUS | Status: DC | PRN
  Administered 2023-09-21: 10 mg via INTRAVENOUS
  Administered 2023-09-21: 60 mg via INTRAVENOUS

## 2023-09-21 MED ORDER — CHLORHEXIDINE GLUCONATE 0.12 % MT SOLN
15.0000 mL | Freq: Once | OROMUCOSAL | Status: AC
Start: 2023-09-21 — End: 2023-09-21
  Administered 2023-09-21: 15 mL via OROMUCOSAL

## 2023-09-21 MED ORDER — OXYCODONE HCL 5 MG PO TABS: 5.0000 mg | ORAL_TABLET | Freq: Once | ORAL | Status: DC | PRN

## 2023-09-21 MED ORDER — LACTATED RINGERS IV SOLN: INTRAVENOUS | Status: DC

## 2023-09-21 MED ORDER — TRANEXAMIC ACID-NACL 1000-0.7 MG/100ML-% IV SOLN
1000.0000 mg | INTRAVENOUS | Status: AC
  Administered 2023-09-21: 1000 mg via INTRAVENOUS
  Filled 2023-09-21: qty 100

## 2023-09-21 MED ORDER — ONDANSETRON HCL 4 MG/2ML IJ SOLN
INTRAMUSCULAR | Status: AC
Start: 2023-09-21 — End: 2023-09-21
  Filled 2023-09-21: qty 2

## 2023-09-21 MED ORDER — SODIUM CHLORIDE 0.9 % IR SOLN
Status: DC | PRN
  Administered 2023-09-21 (×2): 3000 mL

## 2023-09-21 MED ORDER — FENTANYL CITRATE PF 50 MCG/ML IJ SOSY: 25.0000 ug | PREFILLED_SYRINGE | INTRAMUSCULAR | Status: DC | PRN

## 2023-09-21 MED ORDER — PROPOFOL 10 MG/ML IV BOLUS
INTRAVENOUS | Status: AC
  Filled 2023-09-21: qty 20

## 2023-09-21 MED ORDER — ACETAMINOPHEN 500 MG PO TABS
1000.0000 mg | ORAL_TABLET | Freq: Once | ORAL | Status: AC
  Administered 2023-09-21: 1000 mg via ORAL
  Filled 2023-09-21: qty 2

## 2023-09-21 MED ORDER — ONDANSETRON HCL 4 MG/2ML IJ SOLN
INTRAMUSCULAR | Status: DC | PRN
  Administered 2023-09-21: 4 mg via INTRAVENOUS

## 2023-09-21 MED ORDER — DEXAMETHASONE SODIUM PHOSPHATE 10 MG/ML IJ SOLN
INTRAMUSCULAR | Status: AC
  Filled 2023-09-21: qty 1

## 2023-09-21 MED ORDER — ORAL CARE MOUTH RINSE: 15.0000 mL | Freq: Once | OROMUCOSAL | Status: AC

## 2023-09-21 MED ORDER — ROCURONIUM BROMIDE 10 MG/ML (PF) SYRINGE
PREFILLED_SYRINGE | INTRAVENOUS | Status: AC
  Filled 2023-09-21: qty 10

## 2023-09-21 MED ORDER — PHENYLEPHRINE HCL-NACL 20-0.9 MG/250ML-% IV SOLN
INTRAVENOUS | Status: DC | PRN
  Administered 2023-09-21: 25 ug/min via INTRAVENOUS

## 2023-09-21 SURGICAL SUPPLY — 27 items
BAG URINE DRAIN 2000ML AR STRL (UROLOGICAL SUPPLIES) ×1 IMPLANT
BAND RUBBER #18 3X1/16 STRL (MISCELLANEOUS) IMPLANT
CATH HEMA 3WAY 30CC 22FR COUDE (CATHETERS) IMPLANT
CATH HEMA 3WAY 30CC 24FR COUDE (CATHETERS) IMPLANT
CATH HEMATURIA 20FR (CATHETERS) IMPLANT
COVER MAYO STAND STRL (DRAPES) ×1 IMPLANT
DRAPE FOOT SWITCH (DRAPES) ×1 IMPLANT
DRAPE SURG IRRIG POUCH 19X23 (DRAPES) IMPLANT
GEL ULTRASOUND 8.5O AQUASONIC (MISCELLANEOUS) ×1 IMPLANT
GLOVE SURG LX STRL 7.5 STRW (GLOVE) ×1 IMPLANT
GOWN STRL REUS W/ TWL XL LVL3 (GOWN DISPOSABLE) ×1 IMPLANT
HANDPIECE AQUABEAM (MISCELLANEOUS) ×1 IMPLANT
HOLDER FOLEY CATH W/STRAP (MISCELLANEOUS) IMPLANT
KIT TURNOVER KIT A (KITS) ×1 IMPLANT
LOOP CUT BIPOLAR 24F LRG (ELECTROSURGICAL) IMPLANT
MANIFOLD NEPTUNE II (INSTRUMENTS) ×1 IMPLANT
MAT ABSORB FLUID 56X50 GRAY (MISCELLANEOUS) ×1 IMPLANT
PACK CYSTO (CUSTOM PROCEDURE TRAY) ×1 IMPLANT
PACK DRAPE AQUABEAM (MISCELLANEOUS) ×1 IMPLANT
PAD PREP 24X48 CUFFED NSTRL (MISCELLANEOUS) ×1 IMPLANT
PIN SAFETY STERILE (MISCELLANEOUS) IMPLANT
SYR 30ML LL (SYRINGE) ×1 IMPLANT
SYRINGE TOOMEY IRRIG 70ML (MISCELLANEOUS) ×2 IMPLANT
TOWEL OR 17X26 10 PK STRL BLUE (TOWEL DISPOSABLE) ×1 IMPLANT
TUBING CONNECTING 10 (TUBING) ×2 IMPLANT
TUBING UROLOGY SET (TUBING) ×1 IMPLANT
UNDERPAD 30X36 HEAVY ABSORB (UNDERPADS AND DIAPERS) ×1 IMPLANT

## 2023-09-21 NOTE — Final Progress Note (Signed)
 F-cath Traction off, CBI clamped by MD.@ 1615

## 2023-09-21 NOTE — Transfer of Care (Signed)
 Immediate Anesthesia Transfer of Care Note  Patient: Frederick Glover  Procedure(s) Performed: ABLATION, PROSTATE, TRANSURETHRAL, USING WATERJET (Prostate)  Patient Location: PACU  Anesthesia Type:General  Level of Consciousness: awake, alert , oriented, and patient cooperative  Airway & Oxygen Therapy: Patient Spontanous Breathing and Patient connected to face mask oxygen  Post-op Assessment: Report given to RN, Post -op Vital signs reviewed and stable, and Patient moving all extremities  Post vital signs: Reviewed and stable  Last Vitals:  Vitals Value Taken Time  BP 143/73 09/21/23 14:21  Temp    Pulse 62 09/21/23 14:23  Resp 16 09/21/23 14:23  SpO2 100 % 09/21/23 14:23  Vitals shown include unfiled device data.  Last Pain:  Vitals:   09/21/23 1000  TempSrc:   PainSc: 0-No pain         Complications: No notable events documented.

## 2023-09-21 NOTE — Anesthesia Preprocedure Evaluation (Addendum)
 Anesthesia Evaluation  Patient identified by MRN, date of birth, ID band Patient awake    Reviewed: Allergy & Precautions, NPO status , Patient's Chart, lab work & pertinent test results  History of Anesthesia Complications Negative for: history of anesthetic complications  Airway Mallampati: III  TM Distance: >3 FB Neck ROM: Full    Dental  (+) Dental Advisory Given Bridge in the front:   Pulmonary neg pulmonary ROS   Pulmonary exam normal breath sounds clear to auscultation       Cardiovascular (-) angina + CAD  (-) Past MI and (-) Cardiac Stents + dysrhythmias Atrial Fibrillation + Valvular Problems/Murmurs (moderate PI, mild MR)  Rhythm:Regular Rate:Normal  HLD  TTE 06/22/2023: IMPRESSIONS    1. Left ventricular ejection fraction, by estimation, is 60 to 65%. Left  ventricular ejection fraction by 3D volume is 64 %. The left ventricle has  normal function. The left ventricle has no regional wall motion  abnormalities. There is mild concentric  left ventricular hypertrophy. Left ventricular diastolic parameters are  consistent with Grade I diastolic dysfunction (impaired relaxation). The  average left ventricular global longitudinal strain is -21.8 %. The global  longitudinal strain is normal.   2. Right ventricular systolic function is normal. The right ventricular  size is normal.   3. Left atrial size was moderately dilated.   4. The mitral valve is normal in structure. Mild mitral valve  regurgitation. No evidence of mitral stenosis.   5. The aortic valve is normal in structure. Aortic valve regurgitation is  trivial. No aortic stenosis is present.   6. Pulmonic valve regurgitation is moderate.   7. Aortic dilatation noted. There is mild dilatation of the aortic root,  measuring 40 mm. There is mild dilatation of the ascending aorta,  measuring 43 mm.   Low-risk stress test 07/12/2022    Neuro/Psych negative  neurological ROS     GI/Hepatic negative GI ROS, Neg liver ROS,,,  Endo/Other  negative endocrine ROS    Renal/GU negative Renal ROS   BPH     Musculoskeletal  (+) Arthritis ,    Abdominal   Peds  Hematology negative hematology ROS (+) Lab Results      Component                Value               Date                      WBC                      6.3                 09/12/2023                HGB                      14.6                09/12/2023                HCT                      43.6                09/12/2023                MCV  90.5                09/12/2023                PLT                      191                 09/12/2023              Anesthesia Other Findings   Reproductive/Obstetrics                              Anesthesia Physical Anesthesia Plan  ASA: 2  Anesthesia Plan: General   Post-op Pain Management: Tylenol  PO (pre-op)*   Induction: Intravenous  PONV Risk Score and Plan: 2 and Ondansetron , Dexamethasone  and Treatment may vary due to age or medical condition  Airway Management Planned: Oral ETT  Additional Equipment:   Intra-op Plan:   Post-operative Plan: Extubation in OR  Informed Consent: I have reviewed the patients History and Physical, chart, labs and discussed the procedure including the risks, benefits and alternatives for the proposed anesthesia with the patient or authorized representative who has indicated his/her understanding and acceptance.     Dental advisory given  Plan Discussed with: Anesthesiologist and CRNA  Anesthesia Plan Comments: (Risks of general anesthesia discussed including, but not limited to, sore throat, hoarse voice, chipped/damaged teeth, injury to vocal cords, nausea and vomiting, allergic reactions, lung infection, heart attack, stroke, and death. All questions answered. )         Anesthesia Quick Evaluation

## 2023-09-21 NOTE — Anesthesia Postprocedure Evaluation (Signed)
 Anesthesia Post Note  Patient: Frederick Glover  Procedure(s) Performed: ABLATION, PROSTATE, TRANSURETHRAL, USING WATERJET (Prostate)     Patient location during evaluation: PACU Anesthesia Type: General Level of consciousness: awake Pain management: pain level controlled Vital Signs Assessment: post-procedure vital signs reviewed and stable Respiratory status: spontaneous breathing, nonlabored ventilation and respiratory function stable Cardiovascular status: blood pressure returned to baseline and stable Postop Assessment: no apparent nausea or vomiting Anesthetic complications: no   No notable events documented.  Last Vitals:  Vitals:   09/21/23 1445 09/21/23 1500  BP: 136/71 (!) 145/77  Pulse: (!) 50 (!) 50  Resp: 13 15  Temp:    SpO2: 96% 98%    Last Pain:  Vitals:   09/21/23 1500  TempSrc:   PainSc: 0-No pain                 Delon Aisha Arch

## 2023-09-21 NOTE — H&P (Signed)
 H&P  Chief Complaint: BPH, lower urinary tract symptoms  History of Present Illness: Frederick Glover is a 83 year old male with a history of BPH and lower urinary tract symptoms.  Prostates measured anywhere from 68 to 127 g.  He has a history of urinary retention.  He is on max medical therapy with tamsulosin  and finasteride  with an IPSS of 12.  He continues to have bothersome symptoms.  His Q-Maxx was only 5 cc/s.  He presents today for robotic water  jet ablation of the prostate.  He has been well without dysuria or gross hematuria.  No fever or bladder pain.  No cough cold or congestion.  He does not want to preserve.   Past Medical History:  Diagnosis Date   Allergic rhinitis    Atrial fibrillation (HCC)    atrial flutter PVI/CTI ablation 7/11   BPH (benign prostatic hyperplasia)    Chest pain    Coronary artery disease    DJD (degenerative joint disease)    Dysrhythmia    Hearing loss    Herpes simplex    , left butt   Plantar fasciitis    Rheumatic fever    denies h/o    Tinnitus    Past Surgical History:  Procedure Laterality Date   afib/atrial flutter ablation  09/10/2009   afib ablation by JA   arthroscopic knee surgery Bilateral    CATARACT EXTRACTION, BILATERAL     COLONOSCOPY     HERNIA REPAIR     LAMINECTOMY     s/p appy     TONSILLECTOMY     VASECTOMY      Home Medications:  Medications Prior to Admission  Medication Sig Dispense Refill Last Dose/Taking   acetaminophen  (TYLENOL ) 650 MG CR tablet Take 650 mg by mouth at bedtime as needed for pain.   Taking As Needed   acyclovir  (ZOVIRAX ) 400 MG tablet 2 tablets twice a day for 5 days as needed for flare of fever blister (Patient taking differently: Take 800 mg by mouth 2 (two) times daily as needed (for 5 days during a flair up).)   Taking Differently   aspirin EC 81 MG tablet Take 1 tablet by mouth daily.   Taking   Boswellia-Glucosamine-Vit D (OSTEO BI-FLEX ONE PER DAY) TABS Take 1 tablet by mouth daily.   Taking    finasteride  (PROSCAR ) 5 MG tablet Take 1 tablet (5 mg total) by mouth daily. 30 tablet 0 Taking   ibuprofen (ADVIL) 200 MG tablet Take 200 mg by mouth at bedtime as needed for moderate pain (pain score 4-6).   Taking As Needed   Multiple Vitamins-Minerals (MULTIVITAL) tablet Take 1 tablet by mouth daily.     Taking   rosuvastatin  (CRESTOR ) 10 MG tablet Take 1 tablet (10 mg total) by mouth daily. 90 tablet 3 Taking   tamsulosin  (FLOMAX ) 0.4 MG CAPS capsule Take 1 capsule (0.4 mg total) by mouth daily after supper. 30 capsule 0 Taking   Allergies: No Known Allergies  Family History  Problem Relation Age of Onset   Heart attack Brother    Stroke Mother    Parkinsonism Father    Atrial fibrillation Father    Hypertension Neg Hx    Social History:  reports that he has never smoked. He has never used smokeless tobacco. He reports current alcohol use. He reports that he does not use drugs.  ROS: A complete review of systems was performed.  All systems are negative except for pertinent findings as noted. Review of  Systems  All other systems reviewed and are negative.    Physical Exam:  Vital signs in last 24 hours:   General:  Alert and oriented, No acute distress HEENT: Normocephalic, atraumatic Cardiovascular: Regular rate and rhythm Lungs: Regular rate and effort Abdomen: Soft, nontender, nondistended, no abdominal masses Back: No CVA tenderness Extremities: No edema Neurologic: Grossly intact  Laboratory Data:  No results found for this or any previous visit (from the past 24 hours). No results found for this or any previous visit (from the past 240 hours). Creatinine: No results for input(s): CREATININE in the last 168 hours.  Impression/Assessment/plan:  I discussed with the patient and his daughter the nature, potential benefits, risks and alternatives to robotic water  jet ablation of the prostate, including side effects of the proposed treatment, the likelihood of the  patient achieving the goals of the procedure, and any potential problems that might occur during the procedure or recuperation. All questions answered. Patient elects to proceed.  We also again went over the long-term nature risk benefits and alternatives to continuing tamsulosin  and finasteride .  [name] 09/21/2023

## 2023-09-21 NOTE — Anesthesia Procedure Notes (Signed)
 Procedure Name: Intubation Date/Time: 09/21/2023 12:49 PM  Performed by: Memory Armida LABOR, CRNAPre-anesthesia Checklist: Patient identified, Emergency Drugs available, Suction available, Patient being monitored and Timeout performed Patient Re-evaluated:Patient Re-evaluated prior to induction Oxygen Delivery Method: Circle system utilized Preoxygenation: Pre-oxygenation with 100% oxygen Induction Type: IV induction Ventilation: Mask ventilation without difficulty Laryngoscope Size: Glidescope and 3 Grade View: Grade I Tube type: Parker flex tip Tube size: 7.5 mm Number of attempts: 3 Airway Equipment and Method: Rigid stylet and Video-laryngoscopy Placement Confirmation: ETT inserted through vocal cords under direct vision, positive ETCO2 and breath sounds checked- equal and bilateral Secured at: 21 cm Tube secured with: Tape Dental Injury: Teeth and Oropharynx as per pre-operative assessment  Difficulty Due To: Difficult Airway- due to anterior larynx and Difficult Airway- due to reduced neck mobility Comments: Smooth IV induction with easy mask. DL X 1 with MAC 4 by CRNA. Grade 2a view. - ETCO2. ETT removed. Easy mask. VSS. DL X1 with MAC 4 by Dr Peggye. Grade 2 a view. Glidescope obtained.VSS. DL X 1 with Glidescope #3. Grade 1 view. ATOI. ETT secured at 21 cm at the lip. BBS=.

## 2023-09-21 NOTE — Discharge Instructions (Signed)
Robotic water jet ablation of the Prostate, Care After The following information offers guidance on how to care for yourself after your procedure. Your health care provider may also give you more specific instructions. If you have problems or questions, contact your health care provider. What can I expect after the procedure? After the procedure, it is common to have: Mild pain in your lower abdomen. Soreness or mild discomfort in your penis or when you urinate. This is from having the catheter inserted during the procedure. A sudden urge to urinate (urgency). A need to urinate often. A small amount of blood in your urine. You may notice some small blood clots in your urine. These are normal. Follow these instructions at home: Medicines Take over-the-counter and prescription medicines only as told by your health care provider. If you were prescribed an antibiotic medicine, take it as told by your health care provider. Do not stop taking the antibiotic even if you start to feel better. Activity  Rest as told by your health care provider. Avoid sitting for a long time without moving. Get up to take short walks every 1-2 hours. This is important to improve blood flow and breathing. Ask for help if you feel weak or unsteady. You may increase your physical activity gradually as you start to feel better. Do not drive or operate machinery until your health care provider says that it is safe. Do not ride in a car for long periods of time, or as told by your health care provider. Avoid intense physical activity for as long as told by your health care provider. Do not lift anything that is heavier than 10 lb (4.5 kg), or the limit that you are told, until your health care provider says that it is safe. Do not have sex until your health care provider approves. Return to your normal activities as told by your health care provider. Ask your health care provider what activities are safe for you. Preventing  constipation  You may need to take these actions to prevent or treat constipation: Drink enough fluid to keep your urine pale yellow. Take over-the-counter or prescription medicines. Eat foods that are high in fiber, such as beans, whole grains, and fresh fruits and vegetables. Limit foods that are high in fat and processed sugars, such as fried or sweet foods.   General instructions Do not strain when you have a bowel movement. Straining may lead to bleeding from the prostate. This may cause blood clots and trouble urinating. Do not use any products that contain nicotine or tobacco. These products include cigarettes, chewing tobacco, and vaping devices, such as e-cigarettes. If you need help quitting, ask your health care provider. If you go home with a tube draining your urine (urinary catheter), care for the catheter as told by your health care provider. Wear compression stockings as told by your health care provider. These stockings help to prevent blood clots and reduce swelling in your legs. Keep all follow-up visits. This is important. Contact a health care provider if: You have signs of infection, such as: Fever or chills. Urine that smells very bad. Swelling around your urethra that is getting worse. Swelling in your penis or testicles. You have difficulty urinating. You have pain that gets worse or does not improve with medicine. You have blood in your urine that does not go away after 1 week of resting and drinking more fluids. You have trouble having a bowel movement. You have trouble having or keeping an erection. No  semen comes out during orgasm (dry ejaculation). You have a urinary catheter in place, and you have: Spasms or pain. Problems with your catheter or your catheter is blocked. Get help right away if: You are unable to urinate. You are having more blood clots in your urine instead of fewer. You have: Large blood clots. A lot of blood in your urine. Pain in  your back or lower abdomen. You have difficulty breathing or shortness of breath. You develop swelling or pain in your leg. These symptoms may be an emergency. Get help right away. Call 911. Do not wait to see if the symptoms will go away. Do not drive yourself to the hospital. Summary After the procedure, it is common to have a small amount of blood in your urine. Follow restrictions about lifting and sexual activity as told by your health care provider. Ask what activities are safe for you. Keep all follow-up visits. This is important. This information is not intended to replace advice given to you by your health care provider. Make sure you discuss any questions you have with your health care provider. Document Revised: 11/23/2020 Document Reviewed: 11/23/2020 Elsevier Patient Education  2024 [employer].

## 2023-09-25 NOTE — Op Note (Signed)
 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  After adequate anesthesia he was placed lithotomy position. Timeout was performed to confirm the patient and procedure. The TRUS Stepper was mounted to the Articulating Arm and secured to OR bed. The ultrasound probe was attached to the stepper. Exam under anesthesia was performed and the TRUS was inserted per rectum.  There was no resistance. The ultrasound probe was aligned, and confirmation made that the prostate is centered and aligned using both transverse and sagittal views. The bladder neck, verumontanum and the central/transition zones were identified.  Genitalia were prepped and draped in the usual sterile fashion. The 73F AQUABEAM Handpiece is inserted into the prostatic urethra and a complete cystoscopic evaluation was performed by inspecting the prostate, bladder, and identifying the location of the verumontanum/external sphincter. The AQUABEAM Handpiece was secured to the Handpiece Articulating Arm. Confirmed alignment of AQUABEAM Handpiece and TRUS Probe to be parallel and colinear. Confirmation that AQUABEAM nozzle is centered and anterior of the bladder neck or the median lobe. The cystoscope was then retracted to visualize the verumontanum and external sphincter and the cystoscope tip was positioned just proximal to the external sphincter. Reconfirmed alignment of the TRUS probe with the AQUABEAM Handpiece and compression applied with TRUS probe. Horizontal alignment of  the Handpiece waterjet nozzle was performed. The Aquablation treatment zones were planned utilizing real-time TRUS to visualize the contour of the prostate and the depth and radial angles of resection were defined in the transverse view. In the sagittal view, the AQUABEAM nozzle is identified and position registered with software. The treatment contours were then adjusted to conform to the intended resection margins. The median lobe, bladder neck and verumontanum were marked and confirmed in the treatment contour. The Aquablation Treatment was then started following the resection contour confirmed under ultrasound guidance. TOTAL AQUABLATION RESECTION TIME: Pass one: 3:25 , Pass two: 2:35  Once Aquablation resection was complete the 24 French aqua beam handpiece was carefully removed.  The continuous-flow sheath with the visual obturator was passed and then the loop and handle.  The trigone and the ureteral orifices were identified.  Resection of some of the residual median lobe and bladder neck tissue was done.  The bladder neck was identified at 6:00 and this was taken up to 12:00 with fulguration of the bladder neck and prostate for hemostasis on the right.  Slight amount of anterior tissue was resected.  Similarly from 6:00 up to 12:00 on the left side of the bladder neck was identified by resecting some of the ablated tissue to identify the bladder neck and cauterize any bleeding.  Some anterior tissue on the left was resected.  This created excellent hemostasis.  All the chips were evacuated.  Ureteral orifices again identified and noted to be normal without injury.  The scope was backed out and a 22 [place] hematuria catheter was placed with 30 cc in the balloon.  The balloon was seated at the bladder neck and it was irrigated on light traction and noted to be clear to  pink.  He was hooked up to CBI.  He was cleaned up and placed supine.  Catheter was placed on traction.  He was awakened and taken to the  cover room in stable condition.  Complications: None  Blood loss: 50 mL  Specimens: None  Drains: 22 French three-way hematuria catheter with 30 cc in the balloon  Disposition: Patient stable to PACU

## 2023-09-26 DIAGNOSIS — N3000 Acute cystitis without hematuria: Secondary | ICD-10-CM | POA: Diagnosis not present

## 2023-09-26 DIAGNOSIS — N401 Enlarged prostate with lower urinary tract symptoms: Secondary | ICD-10-CM | POA: Diagnosis not present

## 2023-09-26 DIAGNOSIS — R3915 Urgency of urination: Secondary | ICD-10-CM | POA: Diagnosis not present

## 2023-09-28 DIAGNOSIS — N3000 Acute cystitis without hematuria: Secondary | ICD-10-CM | POA: Diagnosis not present

## 2023-10-13 ENCOUNTER — Emergency Department (HOSPITAL_COMMUNITY)
Admission: EM | Admit: 2023-10-13 | Discharge: 2023-10-13 | Disposition: A | Discharge status: Home / Self Care | Attending: Emergency Medicine | Admitting: Emergency Medicine

## 2023-10-13 ENCOUNTER — Encounter (HOSPITAL_COMMUNITY): Payer: Self-pay

## 2023-10-13 ENCOUNTER — Other Ambulatory Visit: Payer: Self-pay

## 2023-10-13 DIAGNOSIS — Z8551 Personal history of malignant neoplasm of bladder: Secondary | ICD-10-CM | POA: Insufficient documentation

## 2023-10-13 DIAGNOSIS — R310 Gross hematuria: Secondary | ICD-10-CM | POA: Insufficient documentation

## 2023-10-13 DIAGNOSIS — R319 Hematuria, unspecified: Secondary | ICD-10-CM | POA: Diagnosis present

## 2023-10-13 DIAGNOSIS — R58 Hemorrhage, not elsewhere classified: Secondary | ICD-10-CM | POA: Diagnosis not present

## 2023-10-13 DIAGNOSIS — Z7982 Long term (current) use of aspirin: Secondary | ICD-10-CM | POA: Diagnosis not present

## 2023-10-13 LAB — URINALYSIS, ROUTINE W REFLEX MICROSCOPIC
Bacteria, UA: NONE SEEN
Bilirubin Urine: NEGATIVE
Glucose, UA: 50 mg/dL — AB
Ketones, ur: NEGATIVE mg/dL
Leukocytes,Ua: NEGATIVE
Nitrite: NEGATIVE
Protein, ur: 100 mg/dL — AB
RBC / HPF: >50 RBC/hpf (ref 0–5)
Specific Gravity, Urine: 1.023 (ref 1.005–1.030)
pH: 6 (ref 5.0–8.0)

## 2023-10-13 LAB — BASIC METABOLIC PANEL WITH GFR
Anion gap: 11 (ref 5–15)
BUN: 21 mg/dL (ref 8–23)
CO2: 24 mmol/L (ref 22–32)
Calcium: 9.4 mg/dL (ref 8.9–10.3)
Chloride: 105 mmol/L (ref 98–111)
Creatinine, Ser: 0.74 mg/dL (ref 0.61–1.24)
GFR, Estimated: >60 mL/min (ref >60)
Glucose, Bld: 138 mg/dL — ABNORMAL HIGH (ref 70–99)
Potassium: 3.9 mmol/L (ref 3.5–5.1)
Sodium: 140 mmol/L (ref 135–145)

## 2023-10-13 LAB — CBC
HCT: 39.6 % (ref 39.0–52.0)
Hemoglobin: 12.9 g/dL — ABNORMAL LOW (ref 13.0–17.0)
MCH: 29.2 pg (ref 26.0–34.0)
MCHC: 32.6 g/dL (ref 30.0–36.0)
MCV: 89.6 fL (ref 80.0–100.0)
Platelets: 252 K/uL (ref 150–400)
RBC: 4.42 MIL/uL (ref 4.22–5.81)
RDW: 14.4 % (ref 11.5–15.5)
WBC: 5.5 K/uL (ref 4.0–10.5)
nRBC: 0 % (ref 0.0–0.2)

## 2023-10-13 MED ORDER — NITROFURANTOIN MONOHYD MACRO 100 MG PO CAPS: 100.0000 mg | ORAL_CAPSULE | Freq: Two times a day (BID) | ORAL | 0 refills | Status: AC

## 2023-10-13 MED ORDER — NITROFURANTOIN MONOHYD MACRO 100 MG PO CAPS
100.0000 mg | ORAL_CAPSULE | Freq: Once | ORAL | Status: AC
  Administered 2023-10-13: 100 mg via ORAL
  Filled 2023-10-13: qty 1

## 2023-10-13 NOTE — ED Triage Notes (Signed)
 Recent prostate surgery. Pt reports constant urethra bleeding x30 minutes.

## 2023-10-13 NOTE — ED Provider Notes (Signed)
 [facility] AT [facility] Provider Note   CSN: [id number] Arrival date & time: 10/13/23  1842     Patient presents with: Urethra Bleeding   Frederick Glover is a 83 y.o. male.   HPI Patient presents 2 weeks status post  Aquazone ablation due to hematuria. Patient is here with family numbers assist with the history.  He notes that his recovery is generally benign, with decreasing amounts of gross hematuria, but over the past 3 days he has had increasing amounts of hematuria including gross blood just prior to ED arrival. No abdominal pain, no lightheadedness, no syncope.  No fever, chills. He has completed his antibiotics.    Prior to Admission medications   Medication Sig Start Date End Date Taking? Authorizing Provider  nitrofurantoin , macrocrystal-monohydrate, (MACROBID ) 100 MG capsule Take 1 capsule (100 mg total) by mouth 2 (two) times daily. 10/13/23  Yes Garrick Charleston, MD  acetaminophen  (TYLENOL ) 650 MG CR tablet Take 650 mg by mouth at bedtime as needed for pain.    [provider]  acyclovir  (ZOVIRAX ) 400 MG tablet 2 tablets twice a day for 5 days as needed for flare of fever blister Patient taking differently: Take 800 mg by mouth 2 (two) times daily as needed (for 5 days during a flair up). 01/31/13   Dann Candyce RAMAN, MD  aspirin EC 81 MG tablet Take 1 tablet by mouth daily.    [provider]  Boswellia-Glucosamine-Vit D (OSTEO BI-FLEX ONE PER DAY) TABS Take 1 tablet by mouth daily.    [provider]  finasteride  (PROSCAR ) 5 MG tablet Take 1 tablet (5 mg total) by mouth daily. 06/17/22   Drusilla Sabas RAMAN, MD  ibuprofen (ADVIL) 200 MG tablet Take 200 mg by mouth at bedtime as needed for moderate pain (pain score 4-6).    [provider]  Multiple Vitamins-Minerals (MULTIVITAL) tablet Take 1 tablet by mouth daily.      [provider]  rosuvastatin  (CRESTOR ) 10 MG tablet Take 1 tablet (10 mg total)  by mouth daily. 06/20/23 09/21/23  Kate Lonni CROME, MD  tamsulosin  (FLOMAX ) 0.4 MG CAPS capsule Take 1 capsule (0.4 mg total) by mouth daily after supper. 06/16/22   Drusilla Sabas RAMAN, MD    Allergies: Patient has no known allergies.    Review of Systems  Updated Vital Signs BP (!) 139/103   Pulse 93   Temp 98.6 F (37 C)   Resp 18   SpO2 98%   Physical Exam Vitals and nursing note reviewed.  Constitutional:      General: He is not in acute distress.    Appearance: He is well-developed.  HENT:     Head: Normocephalic and atraumatic.  Eyes:     Conjunctiva/sclera: Conjunctivae normal.  Cardiovascular:     Rate and Rhythm: Normal rate and regular rhythm.  Pulmonary:     Effort: Pulmonary effort is normal. No respiratory distress.     Breath sounds: No stridor.  Abdominal:     General: There is no distension.  Skin:    General: Skin is warm and dry.  Neurological:     Mental Status: He is alert and oriented to person, place, and time.     (all labs ordered are listed, but only abnormal results are displayed) Labs Reviewed  URINALYSIS, ROUTINE W REFLEX MICROSCOPIC - Abnormal; Notable for the following components:      Result Value   APPearance TURBID (*)    Glucose,  UA 50 (*)    Hgb urine dipstick LARGE (*)    Protein, ur 100 (*)    All other components within normal limits  CBC - Abnormal; Notable for the following components:   Hemoglobin 12.9 (*)    All other components within normal limits  BASIC METABOLIC PANEL WITH GFR - Abnormal; Notable for the following components:   Glucose, Bld 138 (*)    All other components within normal limits  URINE CULTURE    EKG: None  Radiology: No results found.   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 Patient is producing urine which is somewhat reassuring, urine ultrasound with 133 mL, no evidence for obstruction.  Patient is no fever, chills, other complaints, reassuring, but with bleeding, concern for residual infection, exsanguination, labs sent.  Amount and/or Complexity of Data Reviewed Independent Historian: spouse    Details: Daughter, son External Data Reviewed: notes.    Details: Op note reviewed Labs: ordered. Decision-making details documented in ED Course. Discussion of management or test interpretation with external provider(s): I discussed the patient's case with Dr. Jenna, our urology colleague.  We discussed the patient's procedure, antibiotics, urine culture with ESBL, seemingly treated with Keflex  most recently.  Patient will follow-up with urology in the coming days, will start Macrobid , though he has advanced age, renal function is okay, this is an appropriate med for his most recent culture.  Repeat culture sent as well.   Risk Prescription drug management. Decision regarding hospitalization.   10:32 PM Patient, family, aware of all findings, recommendation, follow-up instructions, return precautions.      Final diagnoses:  Gross hematuria    ED Discharge Orders          Ordered    nitrofurantoin , macrocrystal-monohydrate, (MACROBID ) 100 MG capsule  2 times daily        10/13/23 2232               Garrick Charleston, MD 10/13/23 2232

## 2023-10-13 NOTE — Discharge Instructions (Signed)
 As discussed, it is important to follow-up with your urology team on Monday.  Take all medication as prescribed and return here for concerning changes in your condition.

## 2023-10-16 LAB — URINE CULTURE: Culture: 50000 — AB

## 2023-10-17 ENCOUNTER — Telehealth (HOSPITAL_BASED_OUTPATIENT_CLINIC_OR_DEPARTMENT_OTHER): Payer: Self-pay | Admitting: *Deleted

## 2023-10-17 DIAGNOSIS — N3000 Acute cystitis without hematuria: Secondary | ICD-10-CM | POA: Diagnosis not present

## 2023-10-17 DIAGNOSIS — R310 Gross hematuria: Secondary | ICD-10-CM | POA: Diagnosis not present

## 2023-10-17 NOTE — Telephone Encounter (Signed)
 Post ED Visit - Positive Culture Follow-up  Culture report reviewed by antimicrobial stewardship pharmacist: [facility] []  Rankin Dee, Pharm.D. []  Venetia Gully, Pharm.D., BCPS AQ-ID []  Garrel Crews, Pharm.D., BCPS []  Almarie Lunger, [address].D., BCPS []  [place], [address].D., BCPS, AAHIVP []  Rosaline Bihari, Pharm.D., BCPS, AAHIVP []  Vernell Meier, PharmD, BCPS []  Latanya Hint, PharmD, BCPS []  Donald Medley, PharmD, BCPS []  Rocky Bold, PharmD []  Dorothyann Alert, PharmD, BCPS []  Morene Babe, PharmD  [facility] [x]  Izetta Carl PharmD []  Romona Bliss, PharmD []  Dolphus Roller, PharmD []  Veva Seip, Rph []  Vernell Daunt) Leonce, PharmD []  Eva Allis, PharmD []  Rosaline Millet, PharmD []  Iantha Batch, PharmD []  Arvin Gauss, PharmD []  Wanda Hasting, PharmD []  Ronal Rav, PharmD []  Rocky Slade, PharmD []  Bard Jeans, PharmD   Positive urine culture Treated with Nitrofurantoin , organism sensitive to the same and no further patient follow-up is required at this time.  Frederick Glover 10/17/2023, 9:54 AM

## 2023-10-29 DIAGNOSIS — Z85828 Personal history of other malignant neoplasm of skin: Secondary | ICD-10-CM | POA: Diagnosis not present

## 2023-10-29 DIAGNOSIS — C44629 Squamous cell carcinoma of skin of left upper limb, including shoulder: Secondary | ICD-10-CM | POA: Diagnosis not present

## 2023-10-29 DIAGNOSIS — S70362A Insect bite (nonvenomous), left thigh, initial encounter: Secondary | ICD-10-CM | POA: Diagnosis not present
